# Patient Record
Sex: Male | Born: 1975 | Race: Black or African American | Hispanic: No | Marital: Married | Smoking: Current every day smoker
Health system: Southern US, Community
[De-identification: ages and names within clinical notes are randomized; demographics above are authoritative.]

## PROBLEM LIST (undated history)

## (undated) DIAGNOSIS — E119 Type 2 diabetes mellitus without complications: Secondary | ICD-10-CM

## (undated) DIAGNOSIS — K219 Gastro-esophageal reflux disease without esophagitis: Secondary | ICD-10-CM

## (undated) HISTORY — PX: WRIST SURGERY: SHX841

---

## 2007-01-23 ENCOUNTER — Emergency Department: Payer: Self-pay | Admitting: Unknown Physician Specialty

## 2007-06-20 ENCOUNTER — Emergency Department: Payer: Self-pay | Admitting: Emergency Medicine

## 2008-05-17 ENCOUNTER — Emergency Department: Payer: Self-pay | Admitting: Emergency Medicine

## 2008-07-07 ENCOUNTER — Emergency Department: Payer: Self-pay | Admitting: Unknown Physician Specialty

## 2009-07-06 ENCOUNTER — Emergency Department: Payer: Self-pay | Admitting: Emergency Medicine

## 2010-06-14 ENCOUNTER — Ambulatory Visit: Payer: Self-pay | Admitting: Family Medicine

## 2011-07-07 ENCOUNTER — Emergency Department: Payer: Self-pay | Admitting: *Deleted

## 2011-07-07 LAB — CBC
HCT: 43.2 % (ref 40.0–52.0)
MCHC: 33.6 g/dL (ref 32.0–36.0)
MCV: 85 fL (ref 80–100)
Platelet: 354 10*3/uL (ref 150–440)
RBC: 5.1 10*6/uL (ref 4.40–5.90)

## 2011-07-07 LAB — COMPREHENSIVE METABOLIC PANEL
Alkaline Phosphatase: 50 U/L (ref 50–136)
Anion Gap: 7 (ref 7–16)
Bilirubin,Total: 0.4 mg/dL (ref 0.2–1.0)
Co2: 26 mmol/L (ref 21–32)
Creatinine: 1.03 mg/dL (ref 0.60–1.30)
Glucose: 108 mg/dL — ABNORMAL HIGH (ref 65–99)
Potassium: 4 mmol/L (ref 3.5–5.1)

## 2013-07-15 ENCOUNTER — Emergency Department: Payer: Self-pay | Admitting: Emergency Medicine

## 2015-06-26 ENCOUNTER — Ambulatory Visit
Admission: EM | Admit: 2015-06-26 | Discharge: 2015-06-26 | Disposition: A | Discharge status: Home / Self Care | Payer: BLUE CROSS/BLUE SHIELD | Attending: Family Medicine | Admitting: Family Medicine

## 2015-06-26 ENCOUNTER — Encounter: Payer: Self-pay | Admitting: *Deleted

## 2015-06-26 DIAGNOSIS — R300 Dysuria: Secondary | ICD-10-CM

## 2015-06-26 DIAGNOSIS — Z202 Contact with and (suspected) exposure to infections with a predominantly sexual mode of transmission: Secondary | ICD-10-CM

## 2015-06-26 HISTORY — DX: Gastro-esophageal reflux disease without esophagitis: K21.9

## 2015-06-26 LAB — URINALYSIS COMPLETE WITH MICROSCOPIC (ARMC ONLY)
BILIRUBIN URINE: NEGATIVE
Bacteria, UA: NONE SEEN
Glucose, UA: NEGATIVE mg/dL
Leukocytes, UA: NEGATIVE
Nitrite: NEGATIVE
Protein, ur: NEGATIVE mg/dL
Squamous Epithelial / LPF: NONE SEEN
pH: 6 (ref 5.0–8.0)

## 2015-06-26 LAB — CHLAMYDIA/NGC RT PCR (ARMC ONLY)
CHLAMYDIA TR: NOT DETECTED
N gonorrhoeae: NOT DETECTED

## 2015-06-26 MED ORDER — AZITHROMYCIN 500 MG PO TABS
1000.0000 mg | ORAL_TABLET | Freq: Once | ORAL | Status: AC
  Administered 2015-06-26: 1000 mg via ORAL

## 2015-06-26 MED ORDER — CEFTRIAXONE SODIUM 250 MG IJ SOLR
250.0000 mg | Freq: Once | INTRAMUSCULAR | Status: AC
  Administered 2015-06-26: 250 mg via INTRAMUSCULAR

## 2015-06-26 NOTE — ED Notes (Signed)
Pt denies itching or rash from medication/injection.

## 2015-06-26 NOTE — ED Notes (Signed)
Patient started having symptoms burning on urination and a constant tingling feeling on the genitales yesterday morning after having intercourse on 2 days ago.

## 2015-06-26 NOTE — ED Provider Notes (Signed)
CSN: [id number]     Arrival date & time 06/26/15  1711 History   First MD Initiated Contact with Patient 06/26/15 1754     Chief Complaint  Patient presents with  . Exposure to STD   (Consider location/radiation/quality/duration/timing/severity/associated sxs/prior Treatment) HPI Comments: 40 yo male with a c/o burning with urination and "tingling" of the penis for 2 days. States had unprotected intercourse earlier in the week and might have been exposed to STD. Also states has had GC and chlamydia in the past and symptoms now are similar. Denies any penile discharge, fevers or chills.   Patient is a 40 y.o. male presenting with STD exposure. The history is provided by the patient.  Exposure to STD    Past Medical History  Diagnosis Date  . GERD (gastroesophageal reflux disease)    Past Surgical History  Procedure Laterality Date  . Wrist surgery Right     severe laceration   History reviewed. No pertinent family history. Social History  Substance Use Topics  . Smoking status: Current Every Day Smoker  . Smokeless tobacco: Never Used  . Alcohol Use: Yes    Review of Systems  Allergies  Review of patient's allergies indicates no known allergies.  Home Medications   Prior to Admission medications   Medication Sig Start Date End Date Taking? Authorizing Provider  omeprazole (PRILOSEC OTC) 20 MG tablet Take 20 mg by mouth daily.   Yes Historical Provider, MD   Meds Ordered and Administered this Visit   Medications  cefTRIAXone (ROCEPHIN) injection 250 mg (250 mg Intramuscular Given 06/26/15 1836)  azithromycin (ZITHROMAX) tablet 1,000 mg (1,000 mg Oral Given 06/26/15 1837)    BP 135/86 mmHg  Pulse 104  Temp(Src) 98.6 F (37 C) (Oral)  Resp 18  Ht 5\' 9"  (1.753 m)  Wt 219 lb (99.338 kg)  BMI 32.33 kg/m2  SpO2 98% No data found.   Physical Exam  Constitutional: He appears well-developed and well-nourished. No distress.  Genitourinary: Testes normal and penis  normal. Cremasteric reflex is present. Right testis shows no tenderness. Left testis shows no tenderness. No penile erythema or penile tenderness. No discharge found.  Lymphadenopathy:       Right: No inguinal adenopathy present.       Left: No inguinal adenopathy present.  Skin: No rash noted. He is not diaphoretic.  Nursing note and vitals reviewed.   ED Course  Procedures (including critical care time)  Labs Review Labs Reviewed  URINALYSIS COMPLETEWITH MICROSCOPIC ([facility] ONLY) - Abnormal; Notable for the following:    Ketones, ur TRACE (*)    Specific Gravity, Urine >1.030 (*)    Hgb urine dipstick TRACE (*)    All other components within normal limits  URINE CULTURE  CHLAMYDIA/NGC RT PCR ([facility] ONLY)  RPR  HIV ANTIBODY (ROUTINE TESTING)    Imaging Review No results found.   Visual Acuity Review  Right Eye Distance:   Left Eye Distance:   Bilateral Distance:    Right Eye Near:   Left Eye Near:    Bilateral Near:         MDM   1. Dysuria   2. STD exposure     1. Lab results and diagnosis reviewed with patient 2. Patient given empiric treatment with rocephin 250mg  IM x 1 and zithromax 1gm po x 1 3. Send urine for GC/Chlamydia test; blood test for RPR and HIV 4. Follow-up prn if symptoms worsen or don't improve  Payton Mccallumrlando Avalie Oconnor, MD 06/26/15 2031

## 2015-06-28 LAB — URINE CULTURE: Culture: NO GROWTH

## 2015-06-28 LAB — RPR: RPR Ser Ql: NONREACTIVE

## 2015-06-28 LAB — HIV ANTIBODY (ROUTINE TESTING W REFLEX): HIV SCREEN 4TH GENERATION: NONREACTIVE

## 2015-07-02 ENCOUNTER — Telehealth: Payer: Self-pay | Admitting: *Deleted

## 2015-07-02 NOTE — ED Notes (Signed)
Called patient and informed him the all of his labs RPR, HIV, Chlamydia, gonorrhea, and UTI came back negative. Patient reported that his sore was improving.

## 2015-09-19 ENCOUNTER — Ambulatory Visit
Admission: EM | Admit: 2015-09-19 | Discharge: 2015-09-19 | Disposition: A | Discharge status: Home / Self Care | Payer: BLUE CROSS/BLUE SHIELD | Attending: Family Medicine | Admitting: Family Medicine

## 2015-09-19 DIAGNOSIS — K0510 Chronic gingivitis, plaque induced: Secondary | ICD-10-CM | POA: Diagnosis not present

## 2015-09-19 DIAGNOSIS — K029 Dental caries, unspecified: Secondary | ICD-10-CM | POA: Diagnosis not present

## 2015-09-19 DIAGNOSIS — R590 Localized enlarged lymph nodes: Secondary | ICD-10-CM

## 2015-09-19 DIAGNOSIS — R599 Enlarged lymph nodes, unspecified: Secondary | ICD-10-CM

## 2015-09-19 MED ORDER — PENICILLIN V POTASSIUM 500 MG PO TABS: 500.0000 mg | ORAL_TABLET | Freq: Three times a day (TID) | ORAL | Status: DC

## 2015-09-19 NOTE — ED Provider Notes (Signed)
CSN: [id number]     Arrival date & time 09/19/15  1530 History   None    Chief Complaint  Patient presents with  . Lymphadenopathy   (Consider location/radiation/quality/duration/timing/severity/associated sxs/prior Treatment) HPI Comments: 40 yo male with a 2 days h/o tender left swollen neck gland. States has dental cavities and a left upper molar broke one week ago. Denies any difficulty breathing or swallowing, fevers ,chills, chest pain, shortness of breath.   The history is provided by the patient.    Past Medical History  Diagnosis Date  . GERD (gastroesophageal reflux disease)    Past Surgical History  Procedure Laterality Date  . Wrist surgery Right     severe laceration   No family history on file. Social History  Substance Use Topics  . Smoking status: Current Every Day Smoker  . Smokeless tobacco: Never Used  . Alcohol Use: Yes    Review of Systems  Allergies  Review of patient's allergies indicates no known allergies.  Home Medications   Prior to Admission medications   Medication Sig Start Date End Date Taking? Authorizing Provider  omeprazole (PRILOSEC OTC) 20 MG tablet Take 20 mg by mouth daily.   Yes Historical Provider, MD  penicillin v potassium (VEETID) 500 MG tablet Take 1 tablet (500 mg total) by mouth 3 (three) times daily. 09/19/15   Payton Mccallum, MD   Meds Ordered and Administered this Visit  Medications - No data to display  BP 136/88 mmHg  Pulse 93  Temp(Src) 98.4 F (36.9 C) (Oral)  Resp 16  Ht 5\' 10"  (1.778 m)  Wt 216 lb (97.977 kg)  BMI 30.99 kg/m2  SpO2 99% No data found.   Physical Exam  Constitutional: He appears well-developed and well-nourished. No distress.  HENT:  Mouth/Throat: Oropharynx is clear and moist. Abnormal dentition (broken tooth left upper molar). Dental caries present.  Gums swollen erythematous  Neck: Normal range of motion. Neck supple.  Lymphadenopathy:    He has cervical adenopathy (left anterior  cervical, tender, mobile).  Skin: He is not diaphoretic.  Nursing note and vitals reviewed.   ED Course  Procedures (including critical care time)  Labs Review Labs Reviewed - No data to display  Imaging Review No results found.   Visual Acuity Review  Right Eye Distance:   Left Eye Distance:   Bilateral Distance:    Right Eye Near:   Left Eye Near:    Bilateral Near:         MDM   1. Gingivitis   2. Dental caries   3. Lymphadenopathy of left cervical region    Discharge Medication List as of 09/19/2015  4:31 PM    START taking these medications   Details  penicillin v potassium (VEETID) 500 MG tablet Take 1 tablet (500 mg total) by mouth 3 (three) times daily., Starting 09/19/2015, Until Discontinued, Normal       1.  diagnosis reviewed with patient 2. rx as per orders above; reviewed possible side effects, interactions, risks and benefits  3. Follow-up with dentist or prn if symptoms worsen or don't improve    Payton Mccallum, MD 09/19/15 [phone]

## 2015-09-19 NOTE — ED Notes (Signed)
As per pt onset yesterday noticed swollen gland on Left side sore to touch and also pain gets worst when pt moves his neck.

## 2015-09-19 NOTE — Discharge Instructions (Signed)

## 2015-11-19 ENCOUNTER — Ambulatory Visit (INDEPENDENT_AMBULATORY_CARE_PROVIDER_SITE_OTHER): Payer: BLUE CROSS/BLUE SHIELD

## 2015-11-19 ENCOUNTER — Ambulatory Visit
Admission: EM | Admit: 2015-11-19 | Discharge: 2015-11-19 | Disposition: A | Discharge status: Home / Self Care | Payer: BLUE CROSS/BLUE SHIELD | Attending: Emergency Medicine | Admitting: Emergency Medicine

## 2015-11-19 DIAGNOSIS — IMO0001 Reserved for inherently not codable concepts without codable children: Secondary | ICD-10-CM

## 2015-11-19 DIAGNOSIS — S8992XA Unspecified injury of left lower leg, initial encounter: Secondary | ICD-10-CM

## 2015-11-19 DIAGNOSIS — M7021 Olecranon bursitis, right elbow: Secondary | ICD-10-CM | POA: Diagnosis not present

## 2015-11-19 MED ORDER — NAPROXEN 500 MG PO TABS: 500.0000 mg | ORAL_TABLET | Freq: Two times a day (BID) | ORAL | 0 refills | Status: DC

## 2015-11-19 NOTE — ED Provider Notes (Signed)
CSN: [id number]     Arrival date & time 11/19/15  1602 History   First MD Initiated Contact with Patient 11/19/15 1628     Chief Complaint  Patient presents with  . Joint Swelling  . Leg Swelling   (Consider location/radiation/quality/duration/timing/severity/associated sxs/prior Treatment) HPI  This a 40 year old male who presents with 2 separate injuries. Both injury started about 2 weeks ago and separate accidents. The first is a calf injury of his left calf that happened at work but the patient does not wish to file [employer]. He states that he was coming down the lead or when he felt a pop in his calf noticed a swelling later on and has remained swollen. He states that he will seem to be improving if the initial injury but reinjured it again and has noticed additional swelling and pain. He has no numbness or tingling into his foot.  Second injury was when he hit his elbow against a Machine at work and noticed pain over the olecranon with swelling that has not decreased. He states today that this is the reason that caused him to come in today more so than the calf. He states that he does not take use his elbow to rest on and does not continues to put pressure on his elbow. 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       Past Medical History:  Diagnosis Date  . GERD (gastroesophageal reflux disease)    Past Surgical History:  Procedure Laterality Date  . WRIST SURGERY Right    severe laceration   History reviewed. No pertinent family history. Social History  Substance Use Topics  . Smoking status: Current Every Day Smoker    Packs/day: 1.00    Years: 24.00  . Smokeless tobacco: Never Used  . Alcohol use Yes    Review of Systems  Constitutional: Negative for activity change, chills, fatigue and fever.  Musculoskeletal: Positive for arthralgias and myalgias.  All other systems reviewed and are negative.   Allergies  Review of patient's allergies  indicates no known allergies.  Home Medications   Prior to Admission medications   Medication Sig Start Date End Date Taking? Authorizing Provider  naproxen (NAPROSYN) 500 MG tablet Take 1 tablet (500 mg total) by mouth 2 (two) times daily with a meal. 11/19/15   Lutricia FeilWilliam P Gavyn Zoss, PA-C  omeprazole (PRILOSEC OTC) 20 MG tablet Take 20 mg by mouth daily.    Historical Provider, MD   Meds Ordered and Administered this Visit  Medications - No data to display  BP (!) 144/87 (BP Location: Left Arm)   Pulse (!) 107   Temp 98.3 F (36.8 C) (Oral)   Resp 16   Ht 5\' 9"  (1.753 m)   Wt 215 lb (97.5 kg)   SpO2 99%   BMI 31.75 kg/m  No data found.   Physical Exam  Constitutional: He is oriented to person, place, and time. He appears well-developed and well-nourished. No distress.  HENT:  Head: Normocephalic and atraumatic.  Eyes: EOM are normal. Pupils are equal, round, and reactive to light. Right eye exhibits no discharge. Left eye exhibits no discharge.  Neck: Normal range of motion. Neck supple.  Musculoskeletal: Normal range of motion. He exhibits edema and tenderness.  Examination of the right dominant elbow shows swelling over the olecranon. It is tender to pressure. There is no warmth or erythema present. Motion of his elbow is full and comfortable.  Examination of the left calf  shows some swelling and is present. Measurement of the left calf is 42 cm . Right calf measurement is 41 cm. Neurovascular function is intact distally.  Neurological: He is alert and oriented to person, place, and time.  Skin: Skin is warm and dry. He is not diaphoretic.  Psychiatric: He has a normal mood and affect. His behavior is normal. Judgment and thought content normal.  Nursing note and vitals reviewed.   Urgent Care Course   Clinical Course    Procedures (including critical care time)  Labs Review Labs Reviewed - No data to display  Imaging Review Dg Elbow Complete Right  Result Date:  11/19/2015 CLINICAL DATA:  Right elbow swelling starting 2 weeks ago after trauma. EXAM: RIGHT ELBOW - COMPLETE 3+ VIEW COMPARISON:  None. FINDINGS: Abnormal soft tissue swelling overlying the olecranon. Olecranon spur noted. Slight irregularity of the coronoid process along the sublime tubercle is probably from spurring, less likely to be from an avulsion. No well-defined fracture identified. IMPRESSION: 1. Abnormal soft tissue swelling dorsal to the olecranon. Olecranon bursitis not excluded. No elbow joint effusion. 2. Suspected spurring along the coronoid process. Electronically Signed   By: Gaylyn Rong M.D.   On: 11/19/2015 17:03     Visual Acuity Review  Right Eye Distance:   Left Eye Distance:   Bilateral Distance:    Right Eye Near:   Left Eye Near:    Bilateral Near:         MDM   1. Olecranon bursitis, right elbow   2. Injury of plantaris muscle or tendon, left, initial encounter    Discharge Medication List as of 11/19/2015  5:23 PM    START taking these medications   Details  naproxen (NAPROSYN) 500 MG tablet Take 1 tablet (500 mg total) by mouth 2 (two) times daily with a meal., Starting Thu 11/19/2015, Normal      Plan: 1. Test/x-ray results and diagnosis reviewed with patient 2. rx as per orders; risks, benefits, potential side effects reviewed with patient 3. Recommend supportive treatment with Heat to the calf. He must that not place any pressure on the elbow. His calf but becomes more swollen or more painful if he develops any numbness or tingling into his foot pain is worse he should go the emergency department. 4. F/u prn if symptoms worsen or don't improve     Lutricia Feil, PA-C 11/19/15 1729

## 2015-11-19 NOTE — ED Triage Notes (Signed)
Patient reports that right elbow and left calf are swollen. Patient states that this started 2 weeks ago. Patient states that he felt a pulling in his left calf when he was trying to reach for something. Patient states that elbow hit something around 2 weeks ago and has remained swollen. Patient denies any pain at either location.

## 2016-01-11 ENCOUNTER — Encounter: Payer: Self-pay | Admitting: *Deleted

## 2016-01-11 ENCOUNTER — Ambulatory Visit
Admission: EM | Admit: 2016-01-11 | Discharge: 2016-01-11 | Disposition: A | Discharge status: Home / Self Care | Payer: BLUE CROSS/BLUE SHIELD | Attending: Family Medicine | Admitting: Family Medicine

## 2016-01-11 ENCOUNTER — Ambulatory Visit (INDEPENDENT_AMBULATORY_CARE_PROVIDER_SITE_OTHER): Payer: BLUE CROSS/BLUE SHIELD

## 2016-01-11 DIAGNOSIS — L03116 Cellulitis of left lower limb: Secondary | ICD-10-CM

## 2016-01-11 DIAGNOSIS — S91332A Puncture wound without foreign body, left foot, initial encounter: Secondary | ICD-10-CM | POA: Diagnosis not present

## 2016-01-11 MED ORDER — TETANUS-DIPHTH-ACELL PERTUSSIS 5-2.5-18.5 LF-MCG/0.5 IM SUSP
0.5000 mL | Freq: Once | INTRAMUSCULAR | Status: AC
  Administered 2016-01-11: 0.5 mL via INTRAMUSCULAR

## 2016-01-11 MED ORDER — CEFTRIAXONE SODIUM 1 G IJ SOLR
1.0000 g | Freq: Once | INTRAMUSCULAR | Status: AC
  Administered 2016-01-11: 1 g via INTRAMUSCULAR

## 2016-01-11 MED ORDER — CIPROFLOXACIN HCL 750 MG PO TABS: 750.0000 mg | ORAL_TABLET | Freq: Two times a day (BID) | ORAL | 0 refills | Status: DC

## 2016-01-11 NOTE — Discharge Instructions (Signed)
Recommend follow up with PCP in 2-3 days for recheck infection or sooner if worsening

## 2016-01-11 NOTE — ED Triage Notes (Signed)
Patient stepped on a nail with his left foot yesterday.

## 2016-01-11 NOTE — ED Provider Notes (Addendum)
[facility]    CSN: [id number] Arrival date & time: 01/11/16  1643     History   Chief Complaint Chief Complaint  Patient presents with  . Foot Pain    HPI Danne Harboreter A Pribyl is a 40 y.o. male.   40 yo male presents with a c/o puncture wound to bottom of left foot after stepping on a nail yesterday. States nail went through his flip-flop and punctured his foot. Today noticed red and swollen. Denies any fevers or chills.    The history is provided by the patient.  Foot Pain     Past Medical History:  Diagnosis Date  . GERD (gastroesophageal reflux disease)     There are no active problems to display for this patient.   Past Surgical History:  Procedure Laterality Date  . WRIST SURGERY Right    severe laceration       Home Medications    Prior to Admission medications   Medication Sig Start Date End Date Taking? Authorizing Provider  ciprofloxacin (CIPRO) 750 MG tablet Take 1 tablet (750 mg total) by mouth 2 (two) times daily. 01/11/16   Payton Mccallumrlando Skyelynn Rambeau, MD  naproxen (NAPROSYN) 500 MG tablet Take 1 tablet (500 mg total) by mouth 2 (two) times daily with a meal. 11/19/15   Lutricia FeilWilliam P Roemer, PA-C  omeprazole (PRILOSEC OTC) 20 MG tablet Take 20 mg by mouth daily.    Historical Provider, MD    Family History History reviewed. No pertinent family history.  Social History Social History  Substance Use Topics  . Smoking status: Current Every Day Smoker    Packs/day: 1.00    Years: 24.00  . Smokeless tobacco: Never Used  . Alcohol use Yes     Allergies   Patient has no known allergies.   Review of Systems Review of Systems   Physical Exam Triage Vital Signs ED Triage Vitals  Enc Vitals Group     BP 01/11/16 1652 (!) 148/81     Pulse Rate 01/11/16 1652 78     Resp 01/11/16 1652 18     Temp 01/11/16 1652 97.2 F (36.2 C)     Temp src --      SpO2 01/11/16 1652 100 %     Weight 01/11/16 1655 215 lb (97.5 kg)     Height 01/11/16 1655 5'  9" (1.753 m)     Head Circumference --      Peak Flow --      Pain Score 01/11/16 1657 10     Pain Loc --      Pain Edu? --      Excl. in GC? --    No data found.   Updated Vital Signs BP (!) 148/81 (BP Location: Left Arm)   Pulse 78   Temp 97.2 F (36.2 C)   Resp 18   Ht 5\' 9"  (1.753 m)   Wt 215 lb (97.5 kg)   SpO2 100%   BMI 31.75 kg/m   Visual Acuity Right Eye Distance:   Left Eye Distance:   Bilateral Distance:    Right Eye Near:   Left Eye Near:    Bilateral Near:     Physical Exam  Constitutional: He appears well-developed and well-nourished. No distress.  Musculoskeletal:       Left foot: There is tenderness and swelling. There is normal range of motion, no bony tenderness, normal capillary refill, no crepitus, no deformity and no laceration.  Left foot neurovascularly intact; pinpoint puncture  wound noted on plantar aspect of left foot; diffuse blanchable erythema, warmth and tenderness to palpation over the dorsum of the foot  Skin: He is not diaphoretic.  Nursing note and vitals reviewed.    UC Treatments / Results  Labs (all labs ordered are listed, but only abnormal results are displayed) Labs Reviewed - No data to display  EKG  EKG Interpretation None       Radiology Dg Foot Complete Left  Result Date: 01/11/2016 CLINICAL DATA:  Stepped on nail EXAM: LEFT FOOT - COMPLETE 3+ VIEW COMPARISON:  None. FINDINGS: No fracture or dislocation is seen. The joint spaces are preserved. Mild soft tissue swelling along the plantar aspect of the forefoot. Radiopaque foreign body is seen. IMPRESSION: Mild soft tissue swelling along the plantar aspect of the forefoot. No fracture, dislocation, or radiopaque foreign body is seen. Electronically Signed   By: Charline BillsSriyesh  Krishnan M.D.   On: 01/11/2016 17:18    Procedures Procedures (including critical care time)  Medications Ordered in UC Medications  Tdap (BOOSTRIX) injection 0.5 mL (0.5 mLs Intramuscular  Given 01/11/16 1704)  cefTRIAXone (ROCEPHIN) injection 1 g (1 g Intramuscular Given 01/11/16 1756)     Initial Impression / Assessment and Plan / UC Course  I have reviewed the triage vital signs and the nursing notes.  Pertinent labs & imaging results that were available during my care of the patient were reviewed by me and considered in my medical decision making (see chart for details).  Clinical Course       Final Clinical Impressions(s) / UC Diagnoses   Final diagnoses:  Puncture wound of left foot, initial encounter  Cellulitis of foot, left    New Prescriptions Discharge Medication List as of 01/11/2016  5:56 PM    START taking these medications   Details  ciprofloxacin (CIPRO) 750 MG tablet Take 1 tablet (750 mg total) by mouth 2 (two) times daily., Starting Mon 01/11/2016, Normal       1. x-ray results (negative for fracture or foreign body) and diagnosis reviewed with patient 2. rx as per orders above; reviewed possible side effects, interactions, risks and benefits  3. Patient given Rocephin 1gm IM x1; also given Tdap vaccine 4. Recommend supportive treatment with warm compresses, elevation, wound care 5. Close follow up recommended and discussed with patient due to potential risks; recommend follow up in 2-3 days (with PCP or here) for recheck or sooner prn if symptoms worsen   Payton Mccallumrlando Lawanna Cecere, MD 01/11/16 1844    Payton Mccallumrlando Shaterrica Territo, MD 01/11/16 1845

## 2016-01-14 ENCOUNTER — Telehealth: Payer: Self-pay | Admitting: *Deleted

## 2016-01-14 NOTE — Telephone Encounter (Signed)
Courtesy call back, verified DOB, patient reported feeling much better and was back to work. Advised patient to follow up with PCP or [facility] if symptoms of infection return.

## 2016-02-06 ENCOUNTER — Emergency Department
Admission: EM | Admit: 2016-02-06 | Discharge: 2016-02-06 | Disposition: A | Discharge status: Home / Self Care | Payer: BLUE CROSS/BLUE SHIELD | Attending: Emergency Medicine | Admitting: Emergency Medicine

## 2016-02-06 ENCOUNTER — Emergency Department: Payer: BLUE CROSS/BLUE SHIELD

## 2016-02-06 ENCOUNTER — Encounter: Payer: Self-pay | Admitting: Emergency Medicine

## 2016-02-06 DIAGNOSIS — F17200 Nicotine dependence, unspecified, uncomplicated: Secondary | ICD-10-CM | POA: Diagnosis not present

## 2016-02-06 DIAGNOSIS — R109 Unspecified abdominal pain: Secondary | ICD-10-CM

## 2016-02-06 DIAGNOSIS — Z79899 Other long term (current) drug therapy: Secondary | ICD-10-CM | POA: Diagnosis not present

## 2016-02-06 DIAGNOSIS — K529 Noninfective gastroenteritis and colitis, unspecified: Secondary | ICD-10-CM | POA: Diagnosis not present

## 2016-02-06 LAB — COMPREHENSIVE METABOLIC PANEL
ALBUMIN: 4.4 g/dL (ref 3.5–5.0)
ALK PHOS: 48 U/L (ref 38–126)
ALT: 41 U/L (ref 17–63)
AST: 39 U/L (ref 15–41)
Anion gap: 6 (ref 5–15)
BILIRUBIN TOTAL: 0.6 mg/dL (ref 0.3–1.2)
BUN: 14 mg/dL (ref 6–20)
CALCIUM: 9.9 mg/dL (ref 8.9–10.3)
CO2: 25 mmol/L (ref 22–32)
Chloride: 106 mmol/L (ref 101–111)
Creatinine, Ser: 0.77 mg/dL (ref 0.61–1.24)
GFR calc Af Amer: >60 mL/min (ref >60)
GFR calc non Af Amer: >60 mL/min (ref >60)
GLUCOSE: 143 mg/dL — AB (ref 65–99)
Potassium: 4.3 mmol/L (ref 3.5–5.1)
SODIUM: 137 mmol/L (ref 135–145)
TOTAL PROTEIN: 7.6 g/dL (ref 6.5–8.1)

## 2016-02-06 LAB — URINALYSIS, COMPLETE (UACMP) WITH MICROSCOPIC
BACTERIA UA: NONE SEEN
Bilirubin Urine: NEGATIVE
GLUCOSE, UA: NEGATIVE mg/dL
KETONES UR: NEGATIVE mg/dL
Leukocytes, UA: NEGATIVE
NITRITE: NEGATIVE
PH: 6 (ref 5.0–8.0)
PROTEIN: NEGATIVE mg/dL
Specific Gravity, Urine: 1.01 (ref 1.005–1.030)
Squamous Epithelial / LPF: NONE SEEN

## 2016-02-06 LAB — CBC
HCT: 42.7 % (ref 40.0–52.0)
HEMOGLOBIN: 14.7 g/dL (ref 13.0–18.0)
MCH: 27.4 pg (ref 26.0–34.0)
MCHC: 34.4 g/dL (ref 32.0–36.0)
MCV: 79.8 fL — ABNORMAL LOW (ref 80.0–100.0)
Platelets: 351 10*3/uL (ref 150–440)
RBC: 5.35 MIL/uL (ref 4.40–5.90)
RDW: 14.5 % (ref 11.5–14.5)
WBC: 6.5 10*3/uL (ref 3.8–10.6)

## 2016-02-06 LAB — LIPASE, BLOOD: Lipase: 31 U/L (ref 11–51)

## 2016-02-06 MED ORDER — METRONIDAZOLE 500 MG PO TABS: 500.0000 mg | ORAL_TABLET | Freq: Three times a day (TID) | ORAL | 0 refills | Status: DC

## 2016-02-06 MED ORDER — CIPROFLOXACIN HCL 500 MG PO TABS: 500.0000 mg | ORAL_TABLET | Freq: Two times a day (BID) | ORAL | 0 refills | Status: DC

## 2016-02-06 MED ORDER — SODIUM CHLORIDE 0.9 % IV BOLUS (SEPSIS)
1000.0000 mL | Freq: Once | INTRAVENOUS | Status: AC
  Administered 2016-02-06: 1000 mL via INTRAVENOUS

## 2016-02-06 MED ORDER — IBUPROFEN 600 MG PO TABS: 600.0000 mg | ORAL_TABLET | Freq: Four times a day (QID) | ORAL | 0 refills | Status: DC | PRN

## 2016-02-06 MED ORDER — ONDANSETRON HCL 4 MG/2ML IJ SOLN
4.0000 mg | Freq: Once | INTRAMUSCULAR | Status: AC
  Administered 2016-02-06: 4 mg via INTRAVENOUS
  Filled 2016-02-06: qty 2

## 2016-02-06 MED ORDER — CIPROFLOXACIN HCL 500 MG PO TABS
500.0000 mg | ORAL_TABLET | Freq: Two times a day (BID) | ORAL | 0 refills | Status: DC
Start: 2016-02-06 — End: 2016-02-06

## 2016-02-06 MED ORDER — HYDROCODONE-ACETAMINOPHEN 5-325 MG PO TABS: 1.0000 | ORAL_TABLET | Freq: Four times a day (QID) | ORAL | 0 refills | Status: DC | PRN

## 2016-02-06 MED ORDER — IOPAMIDOL (ISOVUE-300) INJECTION 61%
100.0000 mL | Freq: Once | INTRAVENOUS | Status: AC | PRN
  Administered 2016-02-06: 100 mL via INTRAVENOUS

## 2016-02-06 MED ORDER — ONDANSETRON 4 MG PO TBDP: 4.0000 mg | ORAL_TABLET | Freq: Four times a day (QID) | ORAL | 0 refills | Status: DC | PRN

## 2016-02-06 MED ORDER — CIPROFLOXACIN HCL 500 MG PO TABS
500.0000 mg | ORAL_TABLET | Freq: Two times a day (BID) | ORAL | 0 refills | Status: DC
Start: 2016-02-06 — End: 2016-08-20

## 2016-02-06 MED ORDER — HYDROCODONE-ACETAMINOPHEN 5-325 MG PO TABS
1.0000 | ORAL_TABLET | Freq: Four times a day (QID) | ORAL | 0 refills | Status: DC | PRN
Start: 2016-02-06 — End: 2016-02-06

## 2016-02-06 MED ORDER — MORPHINE SULFATE (PF) 4 MG/ML IV SOLN
6.0000 mg | Freq: Once | INTRAVENOUS | Status: AC
  Administered 2016-02-06: 6 mg via INTRAVENOUS
  Filled 2016-02-06: qty 2

## 2016-02-06 MED ORDER — IOPAMIDOL (ISOVUE-300) INJECTION 61%: 30.0000 mL | Freq: Once | INTRAVENOUS | Status: DC | PRN

## 2016-02-06 NOTE — Discharge Instructions (Signed)
We believe your symptoms are caused by inflammation or possibly infection in the colon (though we can not be certain).  Most of the time this condition (please read through the included information) can be cured with outpatient antibiotics.  Please take the full course of prescribed medication(s) and follow up with the doctors recommended above.  Return to the ED if your abdominal pain worsens or fails to improve, you develop bloody vomiting, bloody diarrhea, you are unable to tolerate fluids due to vomiting, fever greater than 101, or other symptoms that concern you.

## 2016-02-06 NOTE — ED Provider Notes (Signed)
[facility] Emergency Department Provider Note   ____________________________________________   First MD Initiated Contact with Patient 02/06/16 0901     (approximate)  I have reviewed the triage vital signs and the nursing notes.   HISTORY  Chief Complaint Abdominal Pain    HPI Kevin Harris is a 40 y.o. male reports he's been having pain in his right mid abdomen that radiates around towards his right mid to lower back for about 5 days to week. The pain is somewhat intermittent in nature, with some severe worsening sharp pain at times however has had a fairly consistent mild discomfort with episodes of severe worsening between for about 5 days.  Size doctor was scheduled for a CAT scan today to evaluate for a "kidney stone", reports the pain became increased and also if he got a CAT scan today he went to get the report on Monday.  He is told by his doctor this cholesterol is high, and will need treatment for that.  No fevers or chills. Mild nausea but no vomiting. No diarrhea or constipation. Has continued to eat well. Reports presently a moderate to severe pain in the right mid flank. Nothing seems to make it much better and nothing seems to worsen it, though will slowly come off on its own at times  Does use alcohol about 1-2 days a week, sometimes up to a fifth of vodka however. This pain did start a couple days after going out "drinking" with his friends, was not sure that it's related   Past Medical History:  Diagnosis Date  . GERD (gastroesophageal reflux disease)     There are no active problems to display for this patient.   Past Surgical History:  Procedure Laterality Date  . WRIST SURGERY Right    severe laceration    Prior to Admission medications   Medication Sig Start Date End Date Taking? Authorizing Provider  ciprofloxacin (CIPRO) 500 MG tablet Take 1 tablet (500 mg total) by mouth 2 (two) times daily. 02/06/16   Sharyn CreamerMark Quale,  MD  HYDROcodone-acetaminophen (NORCO/VICODIN) 5-325 MG tablet Take 1 tablet by mouth every 6 (six) hours as needed for moderate pain. 02/06/16   Sharyn CreamerMark Quale, MD  metroNIDAZOLE (FLAGYL) 500 MG tablet Take 1 tablet (500 mg total) by mouth 3 (three) times daily. 02/06/16   Sharyn CreamerMark Quale, MD  ondansetron (ZOFRAN ODT) 4 MG disintegrating tablet Take 1 tablet (4 mg total) by mouth every 6 (six) hours as needed for nausea or vomiting. 02/06/16   Sharyn CreamerMark Quale, MD    Allergies Patient has no known allergies.  No family history on file.  Social History Social History  Substance Use Topics  . Smoking status: Current Every Day Smoker    Packs/day: 1.00    Years: 24.00  . Smokeless tobacco: Never Used  . Alcohol use Yes    Review of Systems Constitutional: No fever/chills Eyes: No visual changes. ENT: No sore throat. Cardiovascular: Denies chest pain. Respiratory: Denies shortness of breath. Gastrointestinal: No diarrhea.  No constipation. Genitourinary: Negative for dysuria.Was told there might be some blood in his urine. Musculoskeletal: Negative for back pain. Skin: Negative for rash. Neurological: Negative for headaches, focal weakness or numbness.  10-point ROS otherwise negative.  ____________________________________________   PHYSICAL EXAM:  VITAL SIGNS: ED Triage Vitals  Enc Vitals Group     BP 02/06/16 0844 (!) 177/93     Pulse Rate 02/06/16 0844 89     Resp 02/06/16 0844 18  Temp 02/06/16 0844 97.5 F (36.4 C)     Temp Source 02/06/16 0844 Oral     SpO2 02/06/16 0844 98 %     Weight 02/06/16 0845 228 lb (103.4 kg)     Height 02/06/16 0845 5\' 10"  (1.778 m)     Head Circumference --      Peak Flow --      Pain Score 02/06/16 0845 7     Pain Loc --      Pain Edu? --      Excl. in GC? --     Constitutional: Alert and oriented. Well appearing and in no acute distress.Does appear in moderate pain, sitting up with hand over her right mid back. Eyes: Conjunctivae are  normal. PERRL. EOMI. Head: Atraumatic. Nose: No congestion/rhinnorhea. Mouth/Throat: Mucous membranes are moist.  Oropharynx non-erythematous. Neck: No stridor.   Cardiovascular: Normal rate, regular rhythm. Grossly normal heart sounds.  Good peripheral circulation. Respiratory: Normal respiratory effort.  No retractions. Lungs CTAB. Gastrointestinal: Soft and nontender to for some mild tenderness in the right mid flank. Negative Murphy. No pain in the right lower quadrant. No pain at McBurney's point. No distention. No abdominal bruits. No CVA tenderness. Musculoskeletal: No lower extremity tenderness nor edema.   Neurologic:  Normal speech and language. No gross focal neurologic deficits are appreciated. Skin:  Skin is warm, dry and intact. No rash noted. Psychiatric: Mood and affect are normal. Speech and behavior are normal.  ____________________________________________   LABS (all labs ordered are listed, but only abnormal results are displayed)  Labs Reviewed  COMPREHENSIVE METABOLIC PANEL - Abnormal; Notable for the following:       Result Value   Glucose, Bld 143 (*)    All other components within normal limits  CBC - Abnormal; Notable for the following:    MCV 79.8 (*)    All other components within normal limits  URINALYSIS, COMPLETE (UACMP) WITH MICROSCOPIC - Abnormal; Notable for the following:    Color, Urine STRAW (*)    APPearance CLEAR (*)    Hgb urine dipstick SMALL (*)    All other components within normal limits  LIPASE, BLOOD   ____________________________________________  EKG   ____________________________________________  RADIOLOGY  Ct Abdomen Pelvis W Contrast  Result Date: 02/06/2016 CLINICAL DATA:  Right abdominal and flank pain for 1 week EXAM: CT ABDOMEN AND PELVIS WITH CONTRAST TECHNIQUE: Multidetector CT imaging of the abdomen and pelvis was performed using the standard protocol following bolus administration of intravenous contrast. CONTRAST:   ISOVUE-300 IOPAMIDOL (ISOVUE-300) INJECTION 61% COMPARISON:  06/14/2010 FINDINGS: Lower chest: No acute abnormality. Hepatobiliary: No focal liver abnormality is seen. No gallstones, gallbladder wall thickening, or biliary dilatation. Pancreas: Unremarkable. No pancreatic ductal dilatation or surrounding inflammatory changes. Spleen: Normal in size without focal abnormality. Adrenals/Urinary Tract: Adrenal glands are unremarkable. Kidneys are normal, without renal calculi, focal lesion, or hydronephrosis. Bladder is unremarkable. Stomach/Bowel: Negative for bowel obstruction, significant dilatation, ileus, or free air. Normal appendix. Scattered colonic diverticulosis without acute inflammatory process. Slight wall prominence of the colon diffusely may be secondary to collapse. Difficult to exclude a mild colitis. Vascular/Lymphatic: No significant vascular findings are present. No enlarged abdominal or pelvic lymph nodes. Reproductive: Prostate is unremarkable. Other: No abdominal wall hernia or abnormality. No abdominopelvic ascites. Musculoskeletal: No acute or significant osseous findings. IMPRESSION: Diffuse wall prominence of the colon, suspect related to under distension/collapse, less likely mild diffuse colitis. Scattered colonic diverticulosis. Normal appendix No other acute intra-abdominal or pelvic  process Electronically Signed   By: Judie PetitM.  Shick M.D.   On: 02/06/2016 10:39    ____________________________________________   PROCEDURES  Procedure(s) performed: None  Procedures  Critical Care performed: No  ____________________________________________   INITIAL IMPRESSION / ASSESSMENT AND PLAN / ED COURSE  Pertinent labs & imaging results that were available during my care of the patient were reviewed by me and considered in my medical decision making (see chart for details).  Differential diagnosis includes but is not limited to, abdominal perforation, aortic dissection,  cholecystitis, appendicitis, diverticulitis, colitis, esophagitis/gastritis, kidney stone, pyelonephritis, urinary tract infection, aortic aneurysm. All are considered in decision and treatment plan. Based upon the patient's presentation and risk factors, I'm suspicious for right sided abdominal pathology primarily. He does have elevated cholesterol, almost 1000, and his primary care doctor is aware of this and so does the patient. This could be continuing to something such as pancreatitis that he has no tenderness or pain in the epigastrium or left side of the abdomen. No systemic or infectious symptoms.  With a reported blood I'm suspicious for kidney stones, but also given his exam and no previous history of stones plan to proceed with CT with contrast to evaluate also for other etiologies such as an indolent appendicitis, pyelonephritis, cholecystitis, etc.     Clinical Course as of Feb 05 1130  Sat Feb 06, 2016  1018 Patient resting comfortably. Reports pain is much better.  [MQ]    Clinical Course User Index [MQ] Sharyn CreamerMark Quale, MD   ----------------------------------------- 11:11 AM on 02/06/2016 -----------------------------------------  Patient resting comfortably. The patient reports he's been eating well, stooling normally, and not having any acute infectious symptoms. The CT however shows some mild changes that cannot easily exclude colitis. He does have diverticuli noted. Given his ongoing symptomatology, I discussed with the patient and we will trial him on antibiotics Flagyl and Cipro for a week for improvement as is possible he may have some colitis. In addition, I informed the patient and strongly recommended he follow up closely with his primary as well as a gastroenterologist for further evaluation in the next few days to week.  Patient agreeable with plan.  I will prescribe the patient a narcotic pain medicine due to their condition which I anticipate will cause at least  moderate pain short term. I discussed with the patient safe use of narcotic pain medicines, and that they are not to drive, work in dangerous areas, or ever take more than prescribed (no more than 1 pill every 6 hours). We discussed that this is the type of medication that can be  overdosed on and the risks of this type of medicine. Patient is very agreeable to only use as prescribed and to never use more than prescribed.  Prescriber database checked, patient has no controlled substance prescription last 6 months.  Return precautions and treatment recommendations and follow-up discussed with the patient who is agreeable with the plan.   ____________________________________________   FINAL CLINICAL IMPRESSION(S) / ED DIAGNOSES  Final diagnoses:  Right flank pain  Colitis, acute      NEW MEDICATIONS STARTED DURING THIS VISIT:  New Prescriptions   CIPROFLOXACIN (CIPRO) 500 MG TABLET    Take 1 tablet (500 mg total) by mouth 2 (two) times daily.   HYDROCODONE-ACETAMINOPHEN (NORCO/VICODIN) 5-325 MG TABLET    Take 1 tablet by mouth every 6 (six) hours as needed for moderate pain.   METRONIDAZOLE (FLAGYL) 500 MG TABLET    Take 1 tablet (500 mg  total) by mouth 3 (three) times daily.   ONDANSETRON (ZOFRAN ODT) 4 MG DISINTEGRATING TABLET    Take 1 tablet (4 mg total) by mouth every 6 (six) hours as needed for nausea or vomiting.     Note:  This document was prepared using Dragon voice recognition software and may include unintentional dictation errors.     Sharyn Creamer, MD 02/06/16 1131

## 2016-02-06 NOTE — ED Triage Notes (Signed)
R sided abdominal pain x 5 days. Saw MD earlier in week.

## 2016-02-06 NOTE — ED Notes (Signed)
This RN to bedside at this time. Explained to patient, whom this RN had previously met, that she would be resuming patient care. Pt states understanding. Pt family at bedside. Pt denies any needs at this time, will continue to monitor for further patient needs.

## 2016-02-06 NOTE — ED Notes (Signed)
NAD noted at time of D/C. Pt denies questions or concerns. Pt ambulatory to the lobby at this time. Pt refused wheelchair to the lobby. Pt D/C into the care of his mother.

## 2016-02-06 NOTE — ED Notes (Signed)
This RN to bedside, explained and apologized for delay to patient and family. Pt and family states understanding at this time. Will continue to monitor for further patient needs at this time.

## 2016-08-20 ENCOUNTER — Ambulatory Visit
Admission: EM | Admit: 2016-08-20 | Discharge: 2016-08-20 | Disposition: A | Discharge status: Home / Self Care | Payer: BLUE CROSS/BLUE SHIELD | Attending: Family | Admitting: Family

## 2016-08-20 DIAGNOSIS — S46812A Strain of other muscles, fascia and tendons at shoulder and upper arm level, left arm, initial encounter: Secondary | ICD-10-CM

## 2016-08-20 MED ORDER — CYCLOBENZAPRINE HCL 10 MG PO TABS: ORAL_TABLET | ORAL | 0 refills | Status: DC

## 2016-08-20 MED ORDER — DICLOFENAC SODIUM 75 MG PO TBEC: 75.0000 mg | DELAYED_RELEASE_TABLET | Freq: Two times a day (BID) | ORAL | 0 refills | Status: DC | PRN

## 2016-08-20 NOTE — Discharge Instructions (Addendum)
Recommend start Voltaren 75mg  twice a day as needed for pain. Take Flexeril 10mg - 1/2 to 1 tablet up to 3 times a day as needed for muscle spasms- may take a whole tablet at night. Apply warm moist compresses to area for comfort. Follow-up with your primary care provider in 3 to 4 days if not improving.

## 2016-08-20 NOTE — ED Triage Notes (Signed)
10 days of left shoulder pain mostly posterior. Unknown injury. Able to lift arm. Worse with some movements. Feels somewhat better when laying on it and pressure applied. Pain 8/10. Pain sometimes goes into left side of neck

## 2016-08-20 NOTE — ED Provider Notes (Signed)
CSN: [id number]     Arrival date & time 08/20/16  1126 History   First MD Initiated Contact with Patient 08/20/16 1233     Chief Complaint  Patient presents with  . Shoulder Pain   (Consider location/radiation/quality/duration/timing/severity/associated sxs/prior Treatment) 41 year old male presents with left shoulder/ upper back pain for the past 10 days. No distinct injury. Has been experiencing pain mostly with movement and will radiate to base of neck. Able to fully move arm/shoulder. Denies any fever, neck pain, chest pain, difficulty breathing, numbness or GI symptoms. Has taken Ibuprofen with minimal relief. No chronic health issues- takes no daily medication.    The history is provided by the patient.    Past Medical History:  Diagnosis Date  . GERD (gastroesophageal reflux disease)    Past Surgical History:  Procedure Laterality Date  . WRIST SURGERY Right    severe laceration   History reviewed. No pertinent family history. Social History  Substance Use Topics  . Smoking status: Current Every Day Smoker    Packs/day: 1.00    Years: 24.00  . Smokeless tobacco: Never Used  . Alcohol use Yes     Comment: social    Review of Systems  Constitutional: Negative for chills, fatigue and fever.  Respiratory: Negative for cough, chest tightness, shortness of breath and wheezing.   Cardiovascular: Negative for chest pain.  Gastrointestinal: Negative for abdominal pain, nausea and vomiting.  Musculoskeletal: Positive for back pain (left thoracic) and myalgias. Negative for joint swelling, neck pain and neck stiffness.  Skin: Negative for color change, rash and wound.  Allergic/Immunologic: Negative for immunocompromised state.  Neurological: Negative for dizziness, tremors, seizures, facial asymmetry, weakness, numbness and headaches.  Hematological: Negative for adenopathy. Does not bruise/bleed easily.    Allergies  Patient has no known allergies.  Home Medications    Prior to Admission medications   Medication Sig Start Date End Date Taking? Authorizing Provider  cyclobenzaprine (FLEXERIL) 10 MG tablet Take 1/2 to 1 tablet by mouth up to 3 times a day as needed for muscle pain/spasms. 08/20/16   Sudie GrumblingAmyot, Ann Berry, NP  diclofenac (VOLTAREN) 75 MG EC tablet Take 1 tablet (75 mg total) by mouth 2 (two) times daily as needed for moderate pain. 08/20/16   Sudie GrumblingAmyot, Ann Berry, NP   Meds Ordered and Administered this Visit  Medications - No data to display  BP (!) 163/95 (BP Location: Left Arm)   Pulse (!) 109   Temp 98.7 F (37.1 C) (Oral)   Resp 20   Ht 5\' 9"  (1.753 m)   Wt 227 lb (103 kg)   SpO2 99%   BMI 33.52 kg/m  No data found.   Physical Exam  Constitutional: He is oriented to person, place, and time. He appears well-developed and well-nourished. No distress.  HENT:  Head: Normocephalic and atraumatic.  Right Ear: External ear normal.  Left Ear: External ear normal.  Eyes: Conjunctivae and EOM are normal.  Neck: Normal range of motion. Neck supple.  Pulmonary/Chest: Effort normal and breath sounds normal.  Musculoskeletal: Normal range of motion. He exhibits tenderness. He exhibits no edema.       Thoracic back: He exhibits tenderness, pain and spasm. He exhibits normal range of motion, no swelling, no edema, no deformity and no laceration.       Back:  Tender along the trapezius and Paraspinous muscles. Has full range of motion of shoulder but pain with extension of arm/shoulder. No tenderness at shoulder joint. No  swelling or erythema seen. No neuro deficits noted.   Neurological: He is alert and oriented to person, place, and time. He has normal strength. No sensory deficit.  Skin: Skin is warm and dry. Capillary refill takes less than 2 seconds. No rash noted. No erythema.  Psychiatric: He has a normal mood and affect. His behavior is normal. Judgment and thought content normal.    Urgent Care Course     Procedures (including  critical care time)  Labs Review Labs Reviewed - No data to display  Imaging Review No results found.   Visual Acuity Review  Right Eye Distance:   Left Eye Distance:   Bilateral Distance:    Right Eye Near:   Left Eye Near:    Bilateral Near:         MDM   1. Strain of left trapezius muscle, initial encounter    Discussed that he has most likely strained a muscle- recommend start Voltaren 75mg  twice a day as needed for pain. Take Flexeril 10mg - 1/2 to 1 tablet up to 3 times a day as needed for muscle spasms- may take a whole tablet at night. Apply warm moist compresses to area for comfort. Note written for work for yesterday. Discussed that his blood pressure may be elevated due to pain. Continue to monitor. Follow-up with his primary care provider in 3 to 4 days if not improving.      Sudie Grumbling, NP 08/21/16 [phone]

## 2016-08-29 ENCOUNTER — Emergency Department
Admission: EM | Admit: 2016-08-29 | Discharge: 2016-08-29 | Disposition: A | Discharge status: Home / Self Care | Payer: BLUE CROSS/BLUE SHIELD | Attending: Emergency Medicine | Admitting: Emergency Medicine

## 2016-08-29 ENCOUNTER — Encounter: Payer: Self-pay | Admitting: Emergency Medicine

## 2016-08-29 ENCOUNTER — Emergency Department: Payer: BLUE CROSS/BLUE SHIELD

## 2016-08-29 DIAGNOSIS — N23 Unspecified renal colic: Secondary | ICD-10-CM | POA: Diagnosis not present

## 2016-08-29 DIAGNOSIS — R109 Unspecified abdominal pain: Secondary | ICD-10-CM | POA: Diagnosis present

## 2016-08-29 DIAGNOSIS — R739 Hyperglycemia, unspecified: Secondary | ICD-10-CM | POA: Diagnosis not present

## 2016-08-29 DIAGNOSIS — F17210 Nicotine dependence, cigarettes, uncomplicated: Secondary | ICD-10-CM | POA: Insufficient documentation

## 2016-08-29 LAB — COMPREHENSIVE METABOLIC PANEL
ALBUMIN: 4.6 g/dL (ref 3.5–5.0)
ALK PHOS: 67 U/L (ref 38–126)
ALT: 42 U/L (ref 17–63)
ANION GAP: 10 (ref 5–15)
AST: 19 U/L (ref 15–41)
BILIRUBIN TOTAL: 1.2 mg/dL (ref 0.3–1.2)
BUN: 10 mg/dL (ref 6–20)
CALCIUM: 9.9 mg/dL (ref 8.9–10.3)
CO2: 22 mmol/L (ref 22–32)
Chloride: 96 mmol/L — ABNORMAL LOW (ref 101–111)
Creatinine, Ser: 0.67 mg/dL (ref 0.61–1.24)
GFR calc Af Amer: >60 mL/min (ref >60)
GFR calc non Af Amer: >60 mL/min (ref >60)
GLUCOSE: 334 mg/dL — AB (ref 65–99)
POTASSIUM: 4.3 mmol/L (ref 3.5–5.1)
SODIUM: 128 mmol/L — AB (ref 135–145)
Total Protein: 8.3 g/dL — ABNORMAL HIGH (ref 6.5–8.1)

## 2016-08-29 LAB — CBC
HEMATOCRIT: 44.5 % (ref 40.0–52.0)
HEMOGLOBIN: 15.3 g/dL (ref 13.0–18.0)
MCH: 27.4 pg (ref 26.0–34.0)
MCHC: 34.3 g/dL (ref 32.0–36.0)
MCV: 79.9 fL — ABNORMAL LOW (ref 80.0–100.0)
Platelets: 328 10*3/uL (ref 150–440)
RBC: 5.57 MIL/uL (ref 4.40–5.90)
RDW: 13.6 % (ref 11.5–14.5)
WBC: 11.5 10*3/uL — ABNORMAL HIGH (ref 3.8–10.6)

## 2016-08-29 LAB — URINALYSIS, COMPLETE (UACMP) WITH MICROSCOPIC
BACTERIA UA: NONE SEEN
Bilirubin Urine: NEGATIVE
Glucose, UA: >=500 mg/dL — AB
Ketones, ur: 20 mg/dL — AB
Leukocytes, UA: NEGATIVE
Nitrite: NEGATIVE
PROTEIN: 100 mg/dL — AB
SPECIFIC GRAVITY, URINE: 1.036 — AB (ref 1.005–1.030)
pH: 6 (ref 5.0–8.0)

## 2016-08-29 LAB — LIPASE, BLOOD: Lipase: 34 U/L (ref 11–51)

## 2016-08-29 MED ORDER — KETOROLAC TROMETHAMINE 30 MG/ML IJ SOLN
30.0000 mg | Freq: Once | INTRAMUSCULAR | Status: AC
  Administered 2016-08-29: 30 mg via INTRAVENOUS
  Filled 2016-08-29: qty 1

## 2016-08-29 MED ORDER — MORPHINE SULFATE (PF) 4 MG/ML IV SOLN
4.0000 mg | Freq: Once | INTRAVENOUS | Status: AC
  Administered 2016-08-29: 4 mg via INTRAVENOUS
  Filled 2016-08-29: qty 1

## 2016-08-29 MED ORDER — ONDANSETRON HCL 4 MG/2ML IJ SOLN
4.0000 mg | Freq: Once | INTRAMUSCULAR | Status: AC
  Administered 2016-08-29: 4 mg via INTRAVENOUS
  Filled 2016-08-29: qty 2

## 2016-08-29 MED ORDER — SODIUM CHLORIDE 0.9 % IV SOLN
Freq: Once | INTRAVENOUS | Status: AC
  Administered 2016-08-29: 18:00:00 via INTRAVENOUS

## 2016-08-29 MED ORDER — METFORMIN HCL 500 MG PO TABS: 500.0000 mg | ORAL_TABLET | Freq: Two times a day (BID) | ORAL | 11 refills | Status: DC

## 2016-08-29 MED ORDER — OXYCODONE-ACETAMINOPHEN 5-325 MG PO TABS: 1.0000 | ORAL_TABLET | Freq: Four times a day (QID) | ORAL | 0 refills | Status: DC | PRN

## 2016-08-29 MED ORDER — POLYETHYLENE GLYCOL 3350 17 G PO PACK: 17.0000 g | PACK | Freq: Every day | ORAL | 0 refills | Status: DC

## 2016-08-29 NOTE — ED Provider Notes (Signed)
[facility] Emergency Department Provider Note       Time seen: ----------------------------------------- 5:34 PM on 08/29/2016 -----------------------------------------     I have reviewed the triage vital signs and the nursing notes.   HISTORY   Chief Complaint Abdominal Pain    HPI Kevin Harris is a 41 y.o. male who presents to the ED for abdominal pain since Saturday and also nausea. Nothing makes symptoms better or worse. Patient also notes he has not had a bowel movement through the same time. And has had decreased oral intake. He denies fevers, chills or other complaints.   Past Medical History:  Diagnosis Date  . GERD (gastroesophageal reflux disease)     There are no active problems to display for this patient.   Past Surgical History:  Procedure Laterality Date  . WRIST SURGERY Right    severe laceration    Allergies Patient has no known allergies.  Social History Social History  Substance Use Topics  . Smoking status: Current Every Day Smoker    Packs/day: 1.00    Years: 24.00  . Smokeless tobacco: Never Used  . Alcohol use Yes     Comment: social    Review of Systems Constitutional: Negative for fever. Eyes: Negative for vision changes ENT:  Negative for congestion, sore throat Cardiovascular: Negative for chest pain. Respiratory: Negative for shortness of breath. Gastrointestinal: Positive for abdominal pain, nausea Genitourinary: Negative for dysuria. Musculoskeletal: Negative for back pain. Skin: Negative for rash. Neurological: Negative for headaches, focal weakness or numbness.  All systems negative/normal/unremarkable except as stated in the HPI  ____________________________________________   PHYSICAL EXAM:  VITAL SIGNS: ED Triage Vitals  Enc Vitals Group     BP 08/29/16 1559 (!) 167/97     Pulse Rate 08/29/16 1559 (!) 109     Resp 08/29/16 1559 20     Temp 08/29/16 1559 98 F (36.7 C)   Temp Source 08/29/16 1559 Oral     SpO2 08/29/16 1559 96 %     Weight 08/29/16 1559 215 lb (97.5 kg)     Height 08/29/16 1559 5\' 9"  (1.753 m)     Head Circumference --      Peak Flow --      Pain Score 08/29/16 1606 8     Pain Loc --      Pain Edu? --      Excl. in GC? --     Constitutional: Alert and oriented. Well appearing and in no distress. Eyes: Conjunctivae are normal. Normal extraocular movements. ENT   Head: Normocephalic and atraumatic.   Nose: No congestion/rhinnorhea.   Mouth/Throat: Mucous membranes are moist.   Neck: No stridor. Cardiovascular: Normal rate, regular rhythm. No murmurs, rubs, or gallops. Respiratory: Normal respiratory effort without tachypnea nor retractions. Breath sounds are clear and equal bilaterally. No wheezes/rales/rhonchi. Gastrointestinal: Soft, nonfocal tenderness, hypoactive bowel sounds Musculoskeletal: Nontender with normal range of motion in extremities. No lower extremity tenderness nor edema. Neurologic:  Normal speech and language. No gross focal neurologic deficits are appreciated.  Skin:  Skin is warm, dry and intact. No rash noted. Psychiatric: Mood and affect are normal. Speech and behavior are normal.  ____________________________________________  ED COURSE:  Pertinent labs & imaging results that were available during my care of the patient were reviewed by me and considered in my medical decision making (see chart for details). Patient presents for abdominal pain and nausea, we will assess with labs and imaging as indicated.   Procedures ____________________________________________  LABS (pertinent positives/negatives)  Labs Reviewed  COMPREHENSIVE METABOLIC PANEL - Abnormal; Notable for the following:       Result Value   Sodium 128 (*)    Chloride 96 (*)    Glucose, Bld 334 (*)    Total Protein 8.3 (*)    All other components within normal limits  CBC - Abnormal; Notable for the following:    WBC 11.5  (*)    MCV 79.9 (*)    All other components within normal limits  URINALYSIS, COMPLETE (UACMP) WITH MICROSCOPIC - Abnormal; Notable for the following:    Color, Urine YELLOW (*)    APPearance CLEAR (*)    Specific Gravity, Urine 1.036 (*)    Glucose, UA >=500 (*)    Hgb urine dipstick LARGE (*)    Ketones, ur 20 (*)    Protein, ur 100 (*)    Squamous Epithelial / LPF 0-5 (*)    All other components within normal limits  LIPASE, BLOOD    RADIOLOGY Images were viewed by me  CT renal protocol IMPRESSION: 1.5 mm proximal left ureteral calculus with fullness of the left intrarenal collecting system.  Hepatic steatosis. ____________________________________________  FINAL ASSESSMENT AND PLAN  Abdominal pain, nausea, hyperglycemia  Plan: Patient's labs and imaging were dictated above. Patient had presented for Abdominal pain which was diffuse. He was found have a kidney stone on CT imaging, this discomfort came by his new onset diabetes and his constipation. He'll be discharged with MiraLAX, pain medicine and metformin. He be referred to primary care for outpatient follow-up.   Emily FilbertWilliams, Jonathan E, MD   Note: This note was generated in part or whole with voice recognition software. Voice recognition is usually quite accurate but there are transcription errors that can and very often do occur. I apologize for any typographical errors that were not detected and corrected.     Emily FilbertWilliams, Jonathan E, MD 08/29/16 [phone]

## 2016-08-29 NOTE — ED Triage Notes (Signed)
Abdominal pain since Saturday.  Also c/o nausea.

## 2016-12-13 ENCOUNTER — Ambulatory Visit
Admission: EM | Admit: 2016-12-13 | Discharge: 2016-12-13 | Disposition: A | Discharge status: Home / Self Care | Payer: BLUE CROSS/BLUE SHIELD | Attending: Family Medicine | Admitting: Family Medicine

## 2016-12-13 ENCOUNTER — Encounter: Payer: Self-pay | Admitting: *Deleted

## 2016-12-13 DIAGNOSIS — Z202 Contact with and (suspected) exposure to infections with a predominantly sexual mode of transmission: Secondary | ICD-10-CM | POA: Diagnosis not present

## 2016-12-13 DIAGNOSIS — R300 Dysuria: Secondary | ICD-10-CM | POA: Diagnosis not present

## 2016-12-13 DIAGNOSIS — R369 Urethral discharge, unspecified: Secondary | ICD-10-CM | POA: Diagnosis not present

## 2016-12-13 HISTORY — DX: Type 2 diabetes mellitus without complications: E11.9

## 2016-12-13 LAB — URINALYSIS, COMPLETE (UACMP) WITH MICROSCOPIC
BACTERIA UA: NONE SEEN
Bilirubin Urine: NEGATIVE
GLUCOSE, UA: NEGATIVE mg/dL
Ketones, ur: NEGATIVE mg/dL
Leukocytes, UA: NEGATIVE
Nitrite: NEGATIVE
PH: 6.5 (ref 5.0–8.0)
Protein, ur: NEGATIVE mg/dL
SPECIFIC GRAVITY, URINE: 1.02 (ref 1.005–1.030)
Squamous Epithelial / LPF: NONE SEEN

## 2016-12-13 LAB — CHLAMYDIA/NGC RT PCR (ARMC ONLY)
Chlamydia Tr: NOT DETECTED
N GONORRHOEAE: NOT DETECTED

## 2016-12-13 MED ORDER — CEFTRIAXONE SODIUM 250 MG IJ SOLR
250.0000 mg | Freq: Once | INTRAMUSCULAR | Status: AC
  Administered 2016-12-13: 250 mg via INTRAMUSCULAR

## 2016-12-13 MED ORDER — AZITHROMYCIN 500 MG PO TABS
1000.0000 mg | ORAL_TABLET | Freq: Once | ORAL | Status: AC
  Administered 2016-12-13: 1000 mg via ORAL

## 2016-12-13 NOTE — ED Provider Notes (Signed)
[facility]    CSN: [id number] Arrival date & time: 12/13/16  1008  History   Chief Complaint Chief Complaint  Patient presents with  . Dysuria  . Exposure to STD   HPI  41 year old male with type 2 diabetes presents with the above complaints.   Patient reports that he had unprotected intercourse on Thursday. A few days after, he developed "tingling" and dysuria. No reports of fevers or chills. No flank pain. No hematuria. He is concerned about STD exposure. No reports of lymphadenopathy, although he has had some pain in the left inguinal region. He's had some "dripping" from his penis. No other associated symptoms. No other complaints or concerns at this time.  Past Medical History:  Diagnosis Date  . Diabetes mellitus without complication (HCC)   . GERD (gastroesophageal reflux disease)     Past Surgical History:  Procedure Laterality Date  . WRIST SURGERY Right    severe laceration    Home Medications    Prior to Admission medications   Medication Sig Start Date End Date Taking? Authorizing Provider  metFORMIN (GLUCOPHAGE) 500 MG tablet Take 1 tablet (500 mg total) by mouth 2 (two) times daily with a meal. 08/29/16 08/29/17 Yes Emily Filbert, MD  cyclobenzaprine (FLEXERIL) 10 MG tablet Take 1/2 to 1 tablet by mouth up to 3 times a day as needed for muscle pain/spasms. 08/20/16   Sudie Grumbling, NP  diclofenac (VOLTAREN) 75 MG EC tablet Take 1 tablet (75 mg total) by mouth 2 (two) times daily as needed for moderate pain. 08/20/16   Sudie Grumbling, NP  oxyCODONE-acetaminophen (PERCOCET) 5-325 MG tablet Take 1-2 tablets by mouth every 6 (six) hours as needed. 08/29/16   Emily Filbert, MD  polyethylene glycol (MIRALAX / Ethelene Hal) packet Take 17 g by mouth daily. 08/29/16   Emily Filbert, MD    Family History History reviewed. No pertinent family history.  Social History Social History  Substance Use Topics  . Smoking status: Current Every  Day Smoker    Packs/day: 1.00    Years: 24.00  . Smokeless tobacco: Never Used  . Alcohol use Yes     Comment: social   Allergies   Patient has no known allergies.   Review of Systems Review of Systems  Constitutional: Negative.   Gastrointestinal: Negative for abdominal pain.  Genitourinary: Positive for discharge, dysuria and penile pain. Negative for flank pain and hematuria.   Physical Exam Triage Vital Signs ED Triage Vitals  Enc Vitals Group     BP 12/13/16 1033 139/84     Pulse Rate 12/13/16 1033 91     Resp 12/13/16 1033 16     Temp 12/13/16 1033 98.1 F (36.7 C)     Temp Source 12/13/16 1033 Oral     SpO2 12/13/16 1033 100 %     Weight 12/13/16 1035 208 lb (94.3 kg)     Height 12/13/16 1035  (1.778 m)     Head Circumference --      Peak Flow --      Pain Score --      Pain Loc --      Pain Edu? --      Excl. in GC? --    Updated Vital Signs BP 139/84 (BP Location: Left Arm)   Pulse 91   Temp 98.1 F (36.7 C) (Oral)   Resp 16   Ht  (1.778 m)   Wt 208 lb (94.3 kg)  SpO2 100%   BMI 29.84 kg/m   Physical Exam  Constitutional: He is oriented to person, place, and time. He appears well-developed. No distress.  Cardiovascular: Normal rate and regular rhythm.   No murmur heard. Pulmonary/Chest: Effort normal and breath sounds normal. He has no wheezes. He has no rales.  Abdominal: Soft. He exhibits no distension. There is no tenderness. No hernia.  Genitourinary: Testes normal and penis normal. Circumcised.  Lymphadenopathy: No inguinal adenopathy noted on the left side.  Neurological: He is alert and oriented to person, place, and time.  Psychiatric: He has a normal mood and affect.  Vitals reviewed.  UC Treatments / Results  Labs (all labs ordered are listed, but only abnormal results are displayed) Labs Reviewed  URINALYSIS, COMPLETE (UACMP) WITH MICROSCOPIC - Abnormal; Notable for the following:       Result Value   Hgb urine  dipstick TRACE (*)    All other components within normal limits  CHLAMYDIA/NGC RT PCR [facility] ONLY)   EKG  EKG Interpretation None       Radiology No results found.  Procedures Procedures (including critical care time)  Medications Ordered in UC Medications  azithromycin (ZITHROMAX) tablet 1,000 mg (1,000 mg Oral Given 12/13/16 1105)  cefTRIAXone (ROCEPHIN) injection 250 mg (250 mg Intramuscular Given 12/13/16 1103)   Initial Impression / Assessment and Plan / UC Course  I have reviewed the triage vital signs and the nursing notes.  Pertinent labs & imaging results that were available during my care of the patient were reviewed by me and considered in my medical decision making (see chart for details).   41 year old male presents with dysuria. Given recent unprotected intercourse and history, I am treating him empirically for STD with Rocephin and Azithromycin. Awaiting GC/Chlamydia. Declined HIV/RPR testing.   Final Clinical Impressions(s) / UC Diagnoses   Final diagnoses:  Possible exposure to STD  Dysuria   New Prescriptions Discharge Medication List as of 12/13/2016 11:15 AM     Controlled Substance Prescriptions [place] Controlled Substance Registry consulted? Not Applicable   Tommie Sams, DO 12/13/16 1121

## 2016-12-13 NOTE — ED Triage Notes (Signed)
Dysuria, and possible STD exposure.

## 2016-12-13 NOTE — Discharge Instructions (Signed)
We will call with the results if they are positive.  Take care  Dr. Adriana Simas

## 2017-07-16 ENCOUNTER — Ambulatory Visit: Payer: BLUE CROSS/BLUE SHIELD

## 2017-07-16 ENCOUNTER — Encounter: Payer: Self-pay | Admitting: Gynecology

## 2017-07-16 ENCOUNTER — Other Ambulatory Visit: Payer: Self-pay

## 2017-07-16 ENCOUNTER — Ambulatory Visit
Admission: EM | Admit: 2017-07-16 | Discharge: 2017-07-16 | Disposition: A | Discharge status: Home / Self Care | Payer: BLUE CROSS/BLUE SHIELD | Attending: Family Medicine | Admitting: Family Medicine

## 2017-07-16 DIAGNOSIS — M25441 Effusion, right hand: Secondary | ICD-10-CM | POA: Diagnosis not present

## 2017-07-16 DIAGNOSIS — M79641 Pain in right hand: Secondary | ICD-10-CM

## 2017-07-16 MED ORDER — METHYLPREDNISOLONE 4 MG PO TBPK: ORAL_TABLET | ORAL | 0 refills | Status: DC

## 2017-07-16 NOTE — ED Triage Notes (Addendum)
Per patient x over a month fell and his right index and ring finger painful

## 2017-07-16 NOTE — ED Provider Notes (Signed)
[facility]    CSN: [id number] Arrival date & time: 07/16/17  1516     History   Chief Complaint Chief Complaint  Patient presents with  . Finger Injury    HPI Kevin Harris is a 42 y.o. male presents to the urgent care facility for evaluation of right hand third and fourth digit PIP joint pain.  Symptoms have been present for 4 weeks.  Patient states 1 month ago he fell and is uncertain how exactly he landed on his right hand but states he has had pain and swelling to the right third and fourth PIP joints.  He states he is taken occasional over-the-counter anti-inflammatory medications with no improvement.  He is continue to work.  He denies any numbness or tingling.  No warmth or redness.  He states his pain is moderate.   HPI  Past Medical History:  Diagnosis Date  . Diabetes mellitus without complication (HCC)   . GERD (gastroesophageal reflux disease)     There are no active problems to display for this patient.   Past Surgical History:  Procedure Laterality Date  . WRIST SURGERY Right    severe laceration       Home Medications    Prior to Admission medications   Medication Sig Start Date End Date Taking? Authorizing Provider  glipiZIDE (GLUCOTROL XL) 5 MG 24 hr tablet Take by mouth. 09/02/16 09/02/17 Yes [provider]  metFORMIN (GLUCOPHAGE) 500 MG tablet Take 1 tablet (500 mg total) by mouth 2 (two) times daily with a meal. 08/29/16 08/29/17 Yes Emily Filbert, MD  omeprazole (PRILOSEC OTC) 20 MG tablet Take by mouth. 07/27/12  Yes [provider]  cyclobenzaprine (FLEXERIL) 10 MG tablet Take 1/2 to 1 tablet by mouth up to 3 times a day as needed for muscle pain/spasms. 08/20/16   Sudie Grumbling, NP  diclofenac (VOLTAREN) 75 MG EC tablet Take 1 tablet (75 mg total) by mouth 2 (two) times daily as needed for moderate pain. 08/20/16   Sudie Grumbling, NP  methylPREDNISolone (MEDROL DOSEPAK) 4 MG TBPK tablet Take Medrol Dosepak as  directed 6-day 07/16/17   Evon Slack, PA-C  oxyCODONE-acetaminophen (PERCOCET) 5-325 MG tablet Take 1-2 tablets by mouth every 6 (six) hours as needed. 08/29/16   Emily Filbert, MD  polyethylene glycol (MIRALAX / Ethelene Hal) packet Take 17 g by mouth daily. 08/29/16   Emily Filbert, MD    Family History History reviewed. No pertinent family history.  Social History Social History   Tobacco Use  . Smoking status: Current Every Day Smoker    Packs/day: 1.00    Years: 24.00    Pack years: 24.00  . Smokeless tobacco: Never Used  Substance Use Topics  . Alcohol use: Yes    Comment: social  . Drug use: Yes    Types: Marijuana     Allergies   Patient has no known allergies.   Review of Systems Review of Systems  Constitutional: Negative for fever.  Respiratory: Negative for shortness of breath.   Cardiovascular: Negative for chest pain.  Gastrointestinal: Negative for abdominal pain.  Genitourinary: Negative for difficulty urinating, dysuria and urgency.  Musculoskeletal: Positive for arthralgias and joint swelling. Negative for myalgias.  Skin: Negative for rash.  Neurological: Negative for dizziness, numbness and headaches.     Physical Exam Triage Vital Signs ED Triage Vitals  Enc Vitals Group     BP 07/16/17 1530 (!) 141/96     Pulse  Rate 07/16/17 1530 99     Resp 07/16/17 1530 18     Temp 07/16/17 1530 98.4 F (36.9 C)     Temp Source 07/16/17 1530 Oral     SpO2 07/16/17 1530 100 %     Weight 07/16/17 1527 212 lb (96.2 kg)     Height --      Head Circumference --      Peak Flow --      Pain Score 07/16/17 1527 0     Pain Loc --      Pain Edu? --      Excl. in GC? --    No data found.  Updated Vital Signs BP (!) 141/96 (BP Location: Left Arm)   Pulse 99   Temp 98.4 F (36.9 C) (Oral)   Resp 18   Wt 212 lb (96.2 kg)   SpO2 100%   BMI 30.42 kg/m   Visual Acuity Right Eye Distance:   Left Eye Distance:   Bilateral Distance:     Right Eye Near:   Left Eye Near:    Bilateral Near:     Physical Exam  Constitutional: He is oriented to person, place, and time. He appears well-developed and well-nourished.  HENT:  Head: Normocephalic and atraumatic.  Eyes: Conjunctivae are normal.  Neck: Normal range of motion.  Cardiovascular: Normal rate.  Pulmonary/Chest: Effort normal. No respiratory distress.  Musculoskeletal:  Examination of the right hand shows patient has swelling along the PIP joints of the right third and fourth digits.  He is able to make a full fist but has pain on the PIP joints of flexion.  He is able to maintain full active extension of the digits.  There is no catching triggering locking.  Sensation is intact distally.  There is no warmth or redness throughout the digits.  Neurological: He is alert and oriented to person, place, and time.  Skin: Skin is warm. No rash noted.  Psychiatric: He has a normal mood and affect. His behavior is normal. Thought content normal.     UC Treatments / Results  Labs (all labs ordered are listed, but only abnormal results are displayed) Labs Reviewed - No data to display  EKG None  Radiology Dg Hand Complete Right  Result Date: 07/16/2017 CLINICAL DATA:  Pt fell a month ago and still has pain and swelling 3rd and 4th fingers EXAM: RIGHT HAND - COMPLETE 3+ VIEW COMPARISON:  None. FINDINGS: No evidence of fracture of the carpal or metacarpal bones. Radiocarpal joint is intact. Phalanges are normal. No soft tissue injury. IMPRESSION: No fracture or dislocation. Electronically Signed   By: Genevive Bi M.D.   On: 07/16/2017 15:53    Procedures Procedures (including critical care time)  Medications Ordered in UC Medications - No data to display  Initial Impression / Assessment and Plan / UC Course  I have reviewed the triage vital signs and the nursing notes.  Pertinent labs & imaging results that were available during my care of the patient were  reviewed by me and considered in my medical decision making (see chart for details).     42 year old male with swelling to the right third and fourth PIP joints.  X-rays show no evidence of acute bony abnormality.  I suspect patient sprained these joints and has had continued swelling throughout the PIP joint.  At 4 weeks out I would recommend trying a Medrol Dosepak followed by ibuprofen.  There is no tendon deficits noted.  Patient will  follow with orthopedics if no improvement in 1 week. Final Clinical Impressions(s) / UC Diagnoses   Final diagnoses:  Right hand pain  Swelling of finger joint, right     Discharge Instructions     Please ice swollen digits of the right hand 20 minutes every hour after activity.  Take Medrol Dosepak as prescribed.  After completing Medrol Dosepak would recommend ibuprofen as needed.  Follow-up with orthopedics if no improvement in 1 week.    ED Prescriptions    Medication Sig Dispense Auth. Provider   methylPREDNISolone (MEDROL DOSEPAK) 4 MG TBPK tablet Take Medrol Dosepak as directed 6-day 21 tablet Ronnette Juniper        Evon Slack, [place] 07/16/17 1607

## 2017-07-16 NOTE — Discharge Instructions (Addendum)
Please ice swollen digits of the right hand 20 minutes every hour after activity.  Take Medrol Dosepak as prescribed.  After completing Medrol Dosepak would recommend ibuprofen as needed.  Follow-up with orthopedics if no improvement in 1 week.

## 2018-03-29 ENCOUNTER — Ambulatory Visit (INDEPENDENT_AMBULATORY_CARE_PROVIDER_SITE_OTHER)
Admission: EM | Admit: 2018-03-29 | Discharge: 2018-03-29 | Disposition: A | Discharge status: Nursing Home (Includes ICF) | Payer: BLUE CROSS/BLUE SHIELD | Source: Home / Self Care | Attending: Emergency Medicine | Admitting: Emergency Medicine

## 2018-03-29 DIAGNOSIS — K5900 Constipation, unspecified: Secondary | ICD-10-CM | POA: Diagnosis present

## 2018-03-29 DIAGNOSIS — K760 Fatty (change of) liver, not elsewhere classified: Secondary | ICD-10-CM | POA: Diagnosis present

## 2018-03-29 DIAGNOSIS — K2980 Duodenitis without bleeding: Secondary | ICD-10-CM | POA: Diagnosis not present

## 2018-03-29 DIAGNOSIS — K219 Gastro-esophageal reflux disease without esophagitis: Secondary | ICD-10-CM | POA: Diagnosis present

## 2018-03-29 DIAGNOSIS — E871 Hypo-osmolality and hyponatremia: Secondary | ICD-10-CM | POA: Diagnosis present

## 2018-03-29 DIAGNOSIS — K859 Acute pancreatitis without necrosis or infection, unspecified: Principal | ICD-10-CM | POA: Diagnosis present

## 2018-03-29 DIAGNOSIS — Z833 Family history of diabetes mellitus: Secondary | ICD-10-CM

## 2018-03-29 DIAGNOSIS — Z79899 Other long term (current) drug therapy: Secondary | ICD-10-CM | POA: Diagnosis not present

## 2018-03-29 DIAGNOSIS — Z7984 Long term (current) use of oral hypoglycemic drugs: Secondary | ICD-10-CM | POA: Diagnosis not present

## 2018-03-29 DIAGNOSIS — R109 Unspecified abdominal pain: Secondary | ICD-10-CM | POA: Insufficient documentation

## 2018-03-29 DIAGNOSIS — R631 Polydipsia: Secondary | ICD-10-CM | POA: Diagnosis not present

## 2018-03-29 DIAGNOSIS — F1010 Alcohol abuse, uncomplicated: Secondary | ICD-10-CM | POA: Diagnosis present

## 2018-03-29 DIAGNOSIS — F17210 Nicotine dependence, cigarettes, uncomplicated: Secondary | ICD-10-CM | POA: Diagnosis present

## 2018-03-29 DIAGNOSIS — E1165 Type 2 diabetes mellitus with hyperglycemia: Secondary | ICD-10-CM | POA: Diagnosis not present

## 2018-03-29 DIAGNOSIS — R112 Nausea with vomiting, unspecified: Secondary | ICD-10-CM | POA: Diagnosis present

## 2018-03-29 LAB — GLUCOSE, CAPILLARY: Glucose-Capillary: 255 mg/dL — ABNORMAL HIGH (ref 70–99)

## 2018-03-29 MED ORDER — ONDANSETRON 8 MG PO TBDP
8.0000 mg | ORAL_TABLET | Freq: Once | ORAL | Status: AC
  Administered 2018-03-29: 8 mg via ORAL

## 2018-03-29 MED ORDER — KETOROLAC TROMETHAMINE 60 MG/2ML IM SOLN
30.0000 mg | Freq: Once | INTRAMUSCULAR | Status: AC
  Administered 2018-03-29: 30 mg via INTRAMUSCULAR

## 2018-03-29 NOTE — ED Provider Notes (Signed)
HPI  SUBJECTIVE:  Kevin Harris is a 43 y.o. male who presents with constant, stabbing, nonmigratory, nonradiating right upper quadrant, right flank right lower quadrant and left lower quadrant pain starting at 3 AM today.  States that he ate some chicken that was meant to be dog food last night.  He also drank half a pint of tequila.  His abdominal pain is getting worse. Reports nausea, vomiting, anorexia.  Last bowel movement was yesterday. He also reports polyuria, polydipsia, 6 pound unintentional weight loss over the past 2 or 3 days.  States that he does not check his sugar at home, and that he was taken off his diabetic medication.  He denies fevers, abdominal distention, back pain, dysuria, urgency, cloudy odorous urine, hematuria.  No chest pain, shortness of breath.  He states that he has had abdominal pain like this before and it was found to be kidney stones.  He tried Pepto-Bismol, Alka-Seltzer without improvement of symptoms.  No aggravating factors.  His abdominal pain is not associated with movement.  States the car ride over here was not painful.    He also reports decreased hearing, left ear feeling "clogged".  He wears ear buds.  He reported pain initially and some otorrhea, but this is resolved.  Denies Q-tip insertion.  It is not associated with chewing or yawning.  Reports constant nasal congestion.  He tried peroxide without improvement in symptoms.  No aggravating factors.  He has a past medical history of diabetes nonobstructing nephrolithiasis.  No history of DKA, gallbladder disease, abdominal surgeries, hypertension, UTI, pyelonephritis, pancreatitis.  No history of otitis media.  TUU:[id number], Arlyss Repress, NP   Past Medical History:  Diagnosis Date  . Diabetes mellitus without complication (HCC)   . GERD (gastroesophageal reflux disease)     Past Surgical History:  Procedure Laterality Date  . WRIST SURGERY Right    severe laceration    Family History  Problem  Relation Age of Onset  . Healthy Mother   . Healthy Father     Social History   Tobacco Use  . Smoking status: Current Every Day Smoker    Packs/day: 1.00    Years: 24.00    Pack years: 24.00  . Smokeless tobacco: Never Used  Substance Use Topics  . Alcohol use: Yes    Comment: social  . Drug use: Yes    Types: Marijuana     Current Facility-Administered Medications:  .  ketorolac (TORADOL) injection 30 mg, 30 mg, Intramuscular, Once, Domenick Gong, MD .  ondansetron (ZOFRAN-ODT) disintegrating tablet 8 mg, 8 mg, Oral, Once, Domenick Gong, MD  Current Outpatient Medications:  .  omeprazole (PRILOSEC OTC) 20 MG tablet, Take by mouth., Disp: , Rfl:  .  cyclobenzaprine (FLEXERIL) 10 MG tablet, Take 1/2 to 1 tablet by mouth up to 3 times a day as needed for muscle pain/spasms., Disp: 15 tablet, Rfl: 0 .  diclofenac (VOLTAREN) 75 MG EC tablet, Take 1 tablet (75 mg total) by mouth 2 (two) times daily as needed for moderate pain., Disp: 20 tablet, Rfl: 0 .  glipiZIDE (GLUCOTROL XL) 5 MG 24 hr tablet, Take by mouth., Disp: , Rfl:  .  metFORMIN (GLUCOPHAGE) 500 MG tablet, Take 1 tablet (500 mg total) by mouth 2 (two) times daily with a meal., Disp: 60 tablet, Rfl: 11 .  polyethylene glycol (MIRALAX / GLYCOLAX) packet, Take 17 g by mouth daily., Disp: 14 each, Rfl: 0  No Known Allergies   ROS  As  noted in HPI.   Physical Exam  BP (!) 148/111 (BP Location: Left Arm)   Pulse (!) 124   Temp 99.8 F (37.7 C) (Oral)   Resp 18   Ht 5\' 9"  (1.753 m)   Wt 97.1 kg   SpO2 100%   BMI 31.60 kg/m   Constitutional: Well developed, well nourished, appears uncomfortable. Eyes:  EOMI, conjunctiva normal bilaterally HENT: Normocephalic, atraumatic,mucus membranes moist.  Left external ear, external ear canal normal.  TMs normal bilaterally. Respiratory: Normal inspiratory effort lungs clear bilaterally Cardiovascular: Tachycardia, no murmurs, rubs, gallops. GI: Normal appearance,  soft, nondistended.  Positive right upper quadrant, right flank tenderness.  Negative murphy, McBurney. hypoactive bowel sounds.  No guarding, rebound.  Negative tap table test. Back: No CVAT. skin: No rash, skin intact Musculoskeletal: no deformities Neurologic: Alert & oriented x 3, no focal neuro deficits Psychiatric: Speech and behavior appropriate   ED Course   Medications  ketorolac (TORADOL) injection 30 mg (has no administration in time range)  ondansetron (ZOFRAN-ODT) disintegrating tablet 8 mg (has no administration in time range)    Orders Placed This Encounter  Procedures  . Glucose, capillary    Standing Status:   Standing    Number of Occurrences:   1  . CBG monitoring, ED    Standing Status:   Standing    Number of Occurrences:   1    Results for orders placed or performed during the hospital encounter of 03/29/18 (from the past 24 hour(s))  Glucose, capillary     Status: Abnormal   Collection Time: 03/29/18  9:26 PM  Result Value Ref Range   Glucose-Capillary 255 (H) 70 - 99 mg/dL   No results found.  ED Clinical Impression  Abdominal pain, unspecified abdominal location   ED Assessment/Plan  Patient appears uncomfortable, is tachycardic, hypertensive.  He has right upper quadrant and right flank tenderness.  This could be from the chicken that he ate, however, concern for other cause of his symptoms such as gallbladder disease, nephrolithiasis, obstruction, perforation, pancreatitis.  Patient is slightly hyperglycemic, but doubt DKA.  Giving Toradol 30 mg IM, Zofran 8 mg p.o.  Transferring to the [facility] ED for comprehensive evaluation, IV fluids, pain control.  Feel that he is stable to go by private vehicle.  Discussed labs, MDM, and rationale for transfer to the emergency department.  Advised him to not have anything to eat or drink until his ER evaluation is complete.  Patient and spouse agree with plan.  Meds ordered this encounter  Medications  .  ketorolac (TORADOL) injection 30 mg  . ondansetron (ZOFRAN-ODT) disintegrating tablet 8 mg    *This clinic note was created using Scientist, clinical (histocompatibility and immunogenetics). Therefore, there may be occasional mistakes despite careful proofreading.   ?   Domenick Gong, MD 03/29/18 2205

## 2018-03-29 NOTE — ED Triage Notes (Addendum)
Pt here for abdominal pain that started this morning, cannot eat or have a bowel movement due to the pain. Complains of nausea. Did take Pepto bismol and alka seltzer. Started Saturday with left ear pain

## 2018-03-29 NOTE — Discharge Instructions (Addendum)
Go to the [facility] ED right now for a comprehensive evaluation, pain control and IV fluids.  Let them know if your abdominal pain changes, gets worse.  Do not have anything to eat or drink until your ER evaluation is complete.

## 2018-03-30 ENCOUNTER — Observation Stay
Admission: EM | Admit: 2018-03-30 | Discharge: 2018-03-31 | DRG: 439 | Discharge status: Against Medical Advice | Payer: BLUE CROSS/BLUE SHIELD | Attending: Internal Medicine | Admitting: Internal Medicine

## 2018-03-30 ENCOUNTER — Encounter: Payer: Self-pay | Admitting: Emergency Medicine

## 2018-03-30 ENCOUNTER — Emergency Department: Payer: BLUE CROSS/BLUE SHIELD

## 2018-03-30 ENCOUNTER — Other Ambulatory Visit: Payer: Self-pay

## 2018-03-30 DIAGNOSIS — K859 Acute pancreatitis without necrosis or infection, unspecified: Secondary | ICD-10-CM | POA: Diagnosis present

## 2018-03-30 DIAGNOSIS — K2980 Duodenitis without bleeding: Secondary | ICD-10-CM

## 2018-03-30 DIAGNOSIS — R109 Unspecified abdominal pain: Secondary | ICD-10-CM

## 2018-03-30 LAB — URINALYSIS, COMPLETE (UACMP) WITH MICROSCOPIC
Bacteria, UA: NONE SEEN
Bilirubin Urine: NEGATIVE
Glucose, UA: >=500 mg/dL — AB
KETONES UR: 20 mg/dL — AB
LEUKOCYTES UA: NEGATIVE
NITRITE: NEGATIVE
PROTEIN: NEGATIVE mg/dL
Specific Gravity, Urine: 1.021 (ref 1.005–1.030)
pH: 6 (ref 5.0–8.0)

## 2018-03-30 LAB — COMPREHENSIVE METABOLIC PANEL
ALT: 32 U/L (ref 0–44)
AST: 20 U/L (ref 15–41)
Albumin: 4.5 g/dL (ref 3.5–5.0)
Alkaline Phosphatase: 53 U/L (ref 38–126)
Anion gap: 10 (ref 5–15)
BUN: 10 mg/dL (ref 6–20)
CO2: 24 mmol/L (ref 22–32)
Calcium: 9.2 mg/dL (ref 8.9–10.3)
Chloride: 95 mmol/L — ABNORMAL LOW (ref 98–111)
Creatinine, Ser: 0.84 mg/dL (ref 0.61–1.24)
GFR calc non Af Amer: >60 mL/min (ref >60)
Glucose, Bld: 315 mg/dL — ABNORMAL HIGH (ref 70–99)
POTASSIUM: 3.8 mmol/L (ref 3.5–5.1)
Sodium: 129 mmol/L — ABNORMAL LOW (ref 135–145)
Total Bilirubin: 0.9 mg/dL (ref 0.3–1.2)
Total Protein: 7.5 g/dL (ref 6.5–8.1)

## 2018-03-30 LAB — GLUCOSE, CAPILLARY
Glucose-Capillary: 276 mg/dL — ABNORMAL HIGH (ref 70–99)
Glucose-Capillary: 244 mg/dL — ABNORMAL HIGH (ref 70–99)
Glucose-Capillary: 202 mg/dL — ABNORMAL HIGH (ref 70–99)

## 2018-03-30 LAB — CBC WITH DIFFERENTIAL/PLATELET
Abs Immature Granulocytes: 0.05 10*3/uL (ref 0.00–0.07)
Basophils Absolute: 0 10*3/uL (ref 0.0–0.1)
Basophils Relative: 0 %
EOS ABS: 0.1 10*3/uL (ref 0.0–0.5)
Eosinophils Relative: 1 %
HCT: 43.4 % (ref 39.0–52.0)
Hemoglobin: 15.2 g/dL (ref 13.0–17.0)
Immature Granulocytes: 0 %
LYMPHS ABS: 1.4 10*3/uL (ref 0.7–4.0)
Lymphocytes Relative: 11 %
MCH: 27.9 pg (ref 26.0–34.0)
MCHC: 35 g/dL (ref 30.0–36.0)
MCV: 79.8 fL — ABNORMAL LOW (ref 80.0–100.0)
Monocytes Absolute: 0.8 10*3/uL (ref 0.1–1.0)
Monocytes Relative: 7 %
NRBC: 0 % (ref 0.0–0.2)
Neutro Abs: 9.7 10*3/uL — ABNORMAL HIGH (ref 1.7–7.7)
Neutrophils Relative %: 81 %
Platelets: 314 10*3/uL (ref 150–400)
RBC: 5.44 MIL/uL (ref 4.22–5.81)
RDW: 14.1 % (ref 11.5–15.5)
WBC: 12 10*3/uL — ABNORMAL HIGH (ref 4.0–10.5)

## 2018-03-30 LAB — LIPASE, BLOOD: Lipase: 37 U/L (ref 11–51)

## 2018-03-30 LAB — URINE DRUG SCREEN, QUALITATIVE (ARMC ONLY)
Amphetamines, Ur Screen: NOT DETECTED
Barbiturates, Ur Screen: NOT DETECTED
Benzodiazepine, Ur Scrn: NOT DETECTED
Cannabinoid 50 Ng, Ur ~~LOC~~: NOT DETECTED
Cocaine Metabolite,Ur ~~LOC~~: NOT DETECTED
MDMA (Ecstasy)Ur Screen: NOT DETECTED
Methadone Scn, Ur: NOT DETECTED
OPIATE, UR SCREEN: NOT DETECTED
Phencyclidine (PCP) Ur S: NOT DETECTED
Tricyclic, Ur Screen: NOT DETECTED

## 2018-03-30 LAB — HEMOGLOBIN A1C
Hgb A1c MFr Bld: 8.7 % — ABNORMAL HIGH (ref 4.8–5.6)
Mean Plasma Glucose: 202.99 mg/dL

## 2018-03-30 LAB — PROCALCITONIN: Procalcitonin: <0.1 ng/mL

## 2018-03-30 MED ORDER — SODIUM CHLORIDE 0.9 % IV SOLN
INTRAVENOUS | Status: DC
  Administered 2018-03-30 – 2018-03-31 (×2): via INTRAVENOUS

## 2018-03-30 MED ORDER — MORPHINE SULFATE (PF) 2 MG/ML IV SOLN
2.0000 mg | Freq: Once | INTRAVENOUS | Status: AC
  Administered 2018-03-30: 2 mg via INTRAVENOUS
  Filled 2018-03-30: qty 1

## 2018-03-30 MED ORDER — PANTOPRAZOLE SODIUM 40 MG IV SOLR
40.0000 mg | Freq: Two times a day (BID) | INTRAVENOUS | Status: DC
  Administered 2018-03-30: 40 mg via INTRAVENOUS
  Filled 2018-03-30 (×2): qty 40

## 2018-03-30 MED ORDER — KETOROLAC TROMETHAMINE 30 MG/ML IJ SOLN
30.0000 mg | Freq: Four times a day (QID) | INTRAMUSCULAR | Status: DC | PRN
  Administered 2018-03-30 – 2018-03-31 (×2): 30 mg via INTRAVENOUS
  Filled 2018-03-30 (×2): qty 1

## 2018-03-30 MED ORDER — SODIUM CHLORIDE 0.9 % IV BOLUS
1000.0000 mL | Freq: Once | INTRAVENOUS | Status: AC
  Administered 2018-03-30: 1000 mL via INTRAVENOUS

## 2018-03-30 MED ORDER — ONDANSETRON HCL 4 MG/2ML IJ SOLN
4.0000 mg | Freq: Once | INTRAMUSCULAR | Status: AC
  Administered 2018-03-30: 4 mg via INTRAVENOUS
  Filled 2018-03-30: qty 2

## 2018-03-30 MED ORDER — HYDROMORPHONE HCL 1 MG/ML IJ SOLN
0.5000 mg | Freq: Once | INTRAMUSCULAR | Status: AC
  Administered 2018-03-30: 0.5 mg via INTRAVENOUS
  Filled 2018-03-30: qty 1

## 2018-03-30 MED ORDER — ACETAMINOPHEN 325 MG PO TABS: 650.0000 mg | ORAL_TABLET | Freq: Four times a day (QID) | ORAL | Status: DC | PRN

## 2018-03-30 MED ORDER — PANTOPRAZOLE SODIUM 40 MG IV SOLR
40.0000 mg | Freq: Once | INTRAVENOUS | Status: AC
  Administered 2018-03-30: 40 mg via INTRAVENOUS
  Filled 2018-03-30: qty 40

## 2018-03-30 MED ORDER — POLYETHYLENE GLYCOL 3350 17 G PO PACK: 17.0000 g | PACK | Freq: Every day | ORAL | Status: DC

## 2018-03-30 MED ORDER — INSULIN ASPART 100 UNIT/ML ~~LOC~~ SOLN
0.0000 [IU] | Freq: Every day | SUBCUTANEOUS | Status: DC
  Administered 2018-03-30: 2 [IU] via SUBCUTANEOUS
  Filled 2018-03-30: qty 1

## 2018-03-30 MED ORDER — TRAMADOL HCL 50 MG PO TABS: 50.0000 mg | ORAL_TABLET | Freq: Four times a day (QID) | ORAL | Status: DC | PRN

## 2018-03-30 MED ORDER — ONDANSETRON HCL 4 MG PO TABS: 4.0000 mg | ORAL_TABLET | Freq: Four times a day (QID) | ORAL | Status: DC | PRN

## 2018-03-30 MED ORDER — ACETAMINOPHEN 650 MG RE SUPP: 650.0000 mg | Freq: Four times a day (QID) | RECTAL | Status: DC | PRN

## 2018-03-30 MED ORDER — SODIUM CHLORIDE 0.9 % IV SOLN
1.0000 g | Freq: Once | INTRAVENOUS | Status: AC
  Administered 2018-03-30: 1 g via INTRAVENOUS
  Filled 2018-03-30: qty 10

## 2018-03-30 MED ORDER — IOPAMIDOL (ISOVUE-300) INJECTION 61%
100.0000 mL | Freq: Once | INTRAVENOUS | Status: AC | PRN
  Administered 2018-03-30: 100 mL via INTRAVENOUS

## 2018-03-30 MED ORDER — ONDANSETRON HCL 4 MG/2ML IJ SOLN: 4.0000 mg | Freq: Four times a day (QID) | INTRAMUSCULAR | Status: DC | PRN

## 2018-03-30 MED ORDER — ENOXAPARIN SODIUM 40 MG/0.4ML ~~LOC~~ SOLN
40.0000 mg | SUBCUTANEOUS | Status: DC
  Filled 2018-03-30: qty 0.4

## 2018-03-30 MED ORDER — IOPAMIDOL (ISOVUE-300) INJECTION 61%
30.0000 mL | Freq: Once | INTRAVENOUS | Status: AC | PRN
  Administered 2018-03-30: 30 mL via ORAL

## 2018-03-30 MED ORDER — INSULIN ASPART 100 UNIT/ML ~~LOC~~ SOLN
0.0000 [IU] | Freq: Three times a day (TID) | SUBCUTANEOUS | Status: DC
  Administered 2018-03-30: 3 [IU] via SUBCUTANEOUS
  Filled 2018-03-30: qty 1

## 2018-03-30 MED ORDER — NICOTINE 21 MG/24HR TD PT24
21.0000 mg | MEDICATED_PATCH | Freq: Every day | TRANSDERMAL | Status: DC
  Administered 2018-03-30: 21 mg via TRANSDERMAL
  Filled 2018-03-30: qty 1

## 2018-03-30 NOTE — ED Provider Notes (Addendum)
[facility] Emergency Department Provider Note   ____________________________________________   First MD Initiated Contact with Patient 03/30/18 1235     (approximate)  I have reviewed the triage vital signs and the nursing notes.   HISTORY  Chief Complaint Abdominal Pain    HPI Kevin Harris is a 43 y.o. male who complains of right-sided abdominal pain since Thursday.  It is getting worse.  He has been unable to eat anything.  Eating makes it worse.  Bumps in the road also make it worse.  The pain is moderately severe.  Deep and achy.  According to the note from urgent care yesterday it started after he ate some chicken that was meant to be dog food and drank some tequila.  He had nausea and vomiting.  He has not stooled.  He is having polydipsia and polyuria as well.   Past Medical History:  Diagnosis Date  . Diabetes mellitus without complication (HCC)   . GERD (gastroesophageal reflux disease)     There are no active problems to display for this patient.   Past Surgical History:  Procedure Laterality Date  . WRIST SURGERY Right    severe laceration    Prior to Admission medications   Medication Sig Start Date End Date Taking? Authorizing Provider  omeprazole (PRILOSEC OTC) 20 MG tablet Take by mouth. 07/27/12  Yes [provider]  sildenafil (VIAGRA) 100 MG tablet Take 100 mg by mouth as needed. 09/20/17  Yes [provider]  valACYclovir (VALTREX) 500 MG tablet Take 500 mg by mouth daily. 09/20/17 09/20/18 Yes [provider]  cyclobenzaprine (FLEXERIL) 10 MG tablet Take 1/2 to 1 tablet by mouth up to 3 times a day as needed for muscle pain/spasms. Patient not taking: Reported on 03/30/2018 08/20/16   Sudie GrumblingAmyot, Ann Berry, NP  diclofenac (VOLTAREN) 75 MG EC tablet Take 1 tablet (75 mg total) by mouth 2 (two) times daily as needed for moderate pain. Patient not taking: Reported on 03/30/2018 08/20/16   Sudie GrumblingAmyot, Ann Berry, NP    glipiZIDE (GLUCOTROL XL) 5 MG 24 hr tablet Take by mouth. 09/02/16 09/02/17  [provider]  glipiZIDE (GLUCOTROL XL) 5 MG 24 hr tablet Take 5 mg by mouth daily. 09/02/16   [provider]  metFORMIN (GLUCOPHAGE) 500 MG tablet Take 1 tablet (500 mg total) by mouth 2 (two) times daily with a meal. 08/29/16 08/29/17  Emily FilbertWilliams, Jonathan E, MD  polyethylene glycol (MIRALAX / Ethelene HalGLYCOLAX) packet Take 17 g by mouth daily. 08/29/16   Emily FilbertWilliams, Jonathan E, MD    Allergies Patient has no known allergies.  Family History  Problem Relation Age of Onset  . Healthy Mother   . Healthy Father     Social History Social History   Tobacco Use  . Smoking status: Current Every Day Smoker    Packs/day: 1.00    Years: 24.00    Pack years: 24.00  . Smokeless tobacco: Never Used  Substance Use Topics  . Alcohol use: Yes    Comment: social  . Drug use: Not Currently    Types: Marijuana    Review of Systems  Constitutional: No fever/chills Eyes: No visual changes. ENT: No sore throat. Cardiovascular: Denies chest pain. Respiratory: Denies shortness of breath. Gastrointestinal: See HPI Genitourinary: Negative for dysuria. Musculoskeletal: Negative for back pain. Skin: Negative for rash. Neurological: Negative for headaches, focal weakness   ____________________________________________   PHYSICAL EXAM:  VITAL SIGNS: ED Triage Vitals [03/30/18 1218]  Enc  Vitals Group     BP (!) 153/97     Pulse Rate (!) 128     Resp 18     Temp 98.1 F (36.7 C)     Temp Source Oral     SpO2 100 %     Weight 212 lb (96.2 kg)     Height 5\' 8"  (1.727 m)     Head Circumference      Peak Flow      Pain Score 9     Pain Loc      Pain Edu?      Excl. in GC?     Constitutional: Alert and oriented. Well appearing and in no acute distress. Eyes: Conjunctivae are normal.  Head: Atraumatic. Nose: No congestion/rhinnorhea. Mouth/Throat: Mucous membranes are moist.  Oropharynx  non-erythematous. Neck: No stridor Cardiovascular: Normal rate, regular rhythm. Grossly normal heart sounds.  Good peripheral circulation. Respiratory: Normal respiratory effort.  No retractions. Lungs CTAB. Gastrointestinal: Soft tender to palpation percussion on the right side of the abdomen upper and lower. No distention. No abdominal bruits. No CVA tenderness. Musculoskeletal: No lower extremity tenderness nor edema.  Neurologic:  Normal speech and language. No gross focal neurologic deficits are appreciated.  Skin:  Skin is warm, dry and intact. No rash noted. Psychiatric: Mood and affect are normal. Speech and behavior are normal.  ____________________________________________   LABS (all labs ordered are listed, but only abnormal results are displayed)  Labs Reviewed  COMPREHENSIVE METABOLIC PANEL - Abnormal; Notable for the following components:      Result Value   Sodium 129 (*)    Chloride 95 (*)    Glucose, Bld 315 (*)    All other components within normal limits  URINALYSIS, COMPLETE (UACMP) WITH MICROSCOPIC - Abnormal; Notable for the following components:   Color, Urine STRAW (*)    APPearance CLEAR (*)    Glucose, UA >=500 (*)    Hgb urine dipstick SMALL (*)    Ketones, ur 20 (*)    All other components within normal limits  CBC WITH DIFFERENTIAL/PLATELET - Abnormal; Notable for the following components:   WBC 12.0 (*)    MCV 79.8 (*)    Neutro Abs 9.7 (*)    All other components within normal limits  GLUCOSE, CAPILLARY - Abnormal; Notable for the following components:   Glucose-Capillary 276 (*)    All other components within normal limits  LIPASE, BLOOD  HEMOGLOBIN A1C  CBG MONITORING, ED   ____________________________________________  EKG  EKG read interpreted by me shows sinus tachycardia rate of 132 normal axis no acute ST-T wave changes ____________________________________________  RADIOLOGY  ED MD interpretation:  Official radiology  report(s): Ct Abdomen Pelvis W Contrast  Result Date: 03/30/2018 CLINICAL DATA:  Right side abdominal pain since Thursday, some nausea, denies vomiting or diarrhea. [facility] Tachycardic No surgical hx. EXAM: CT ABDOMEN AND PELVIS WITH CONTRAST TECHNIQUE: Multidetector CT imaging of the abdomen and pelvis was performed using the standard protocol following bolus administration of intravenous contrast. CONTRAST:  100mL ISOVUE-300 IOPAMIDOL (ISOVUE-300) INJECTION 61% COMPARISON:  08/29/2016 FINDINGS: Lower chest: Clear lung bases.  Heart normal in size. Hepatobiliary: Liver mildly enlarged with diffuse decreased attenuation consistent with fatty infiltration. No liver mass or focal lesion. Normal gallbladder. No bile duct dilation Pancreas: Hazy opacity is noted in the peripancreatic fat. Fluid attenuation is noted adjacent to the pancreatic head and second to third portion of the duodenum. Pancreas is size. No pancreatic mass. There is  homogeneous pancreatic enhancement. No evidence of an abscess or pseudocyst. Spleen: Normal in size without focal abnormality. Adrenals/Urinary Tract: Adrenal glands are unremarkable. Kidneys are normal, without renal calculi, focal lesion, or hydronephrosis. Bladder is unremarkable. Stomach/Bowel: Stomach is unremarkable. The 2nd to 3rd portion of the duodenum shows wall thickening, most prominently adjacent to pancreatic head and uncinate process. There is surrounding inflammatory change. Small bowel is normal in caliber. No wall thickening or inflammation. Colon is normal in caliber. There several sigmoid colon diverticula. No diverticulitis or other colonic inflammatory process. Normal appendix visualized. Vascular/Lymphatic: Minor aortic atherosclerosis. Portal vein, splenic vein and superior mesenteric vein are widely patent. No lymphadenopathy. Reproductive: Unremarkable. Other: No abdominal wall hernia or abnormality. No abdominopelvic ascites. Musculoskeletal: No  acute or significant osseous findings. IMPRESSION: 1. There is inflammation surrounding the pancreas, most prominently adjacent to the pancreatic head, as well as inflammation along the 2nd to 3rd portion the duodenum, associated with duodenum wall thickening. Findings are most likely due to pancreatitis with reactive edema of the duodenal wall. Consider duodenitis in the proper clinical setting. No evidence of an abscess or pseudocyst, pancreatic necrosis or other complication. 2. No other acute abnormality within the abdomen or pelvis. 3. Mild hepatomegaly and diffuse hepatic steatosis. 4. Minor aortic atherosclerosis. Electronically Signed   By: Amie Portland M.D.   On: 03/30/2018 14:37    ____________________________________________   PROCEDURES  Procedure(s) performed:   Procedures  Critical Care performed: Critical care time 25 minutes.  This includes reviewing the CT scan talking to the patient reviewing his old records and discussing the patient with Dr. Servando Snare and the hospitalist.  ____________________________________________   INITIAL IMPRESSION / ASSESSMENT AND PLAN / ED COURSE  Discussed patient with Dr. Servando Snare,  GI.  We will get the patient in the hospital for more IV hydration and pain control.  We will get him some Protonix IV and Dr. Daleen Squibb will consult on him consider him for endoscopy tomorrow.          ____________________________________________   FINAL CLINICAL IMPRESSION(S) / ED DIAGNOSES  Final diagnoses:  Abdominal pain, unspecified abdominal location  Duodenitis     ED Discharge Orders    None       Note:  This document was prepared using Dragon voice recognition software and may include unintentional dictation errors.    Arnaldo Natal, MD 03/30/18 1548    Arnaldo Natal, MD 04/10/18 [phone]

## 2018-03-30 NOTE — ED Notes (Signed)
Pt resting in bed at this time comfortably. Pt requesting something for pain, will notify MD. Remains tachycardic at this time.

## 2018-03-30 NOTE — H&P (Signed)
[employer] [facility] [place] [facility]   PATIENT NAME: Kevin Harris    MR#:  [id number]  DATE OF BIRTH:  1975-12-28  DATE OF ADMISSION:  03/30/2018  PRIMARY CARE PHYSICIAN: Titus Mould, NP   REQUESTING/REFERRING PHYSICIAN: Dr. Dorothea Glassman  CHIEF COMPLAINT:   Chief Complaint  Patient presents with  . Abdominal Pain    HISTORY OF PRESENT ILLNESS:  Kevin Harris  is a 43 y.o. male with a known history of diabetes not on any medications, GERD, smoking and alcohol use presents to hospital secondary to worsening abdominal pain. Pain started yesterday morning when he woke up.  The night before the pain started, patient ate some old frozen chicken dumplings.  Also complains of nausea but no vomiting, denies any diarrhea has been constipated for 2 days.  He went to urgent care yesterday but the pain in the advised him to come to the emergency room.  However patient went  Home as he thought he might improve.  Today the pain was still severe radiating to his back and so presented to the ER.  Blood work showed increased blood glucose in the 300 range, leukocytosis with neutrophilia, hyponatremia and CT of the abdomen showing inflammation around the pancreas extending onto the second and third part of duodenal wall.  PAST MEDICAL HISTORY:   Past Medical History:  Diagnosis Date  . Diabetes mellitus without complication (HCC)   . GERD (gastroesophageal reflux disease)     PAST SURGICAL HISTORY:   Past Surgical History:  Procedure Laterality Date  . WRIST SURGERY Right    severe laceration    SOCIAL HISTORY:   Social History   Tobacco Use  . Smoking status: Current Every Day Smoker    Packs/day: 1.00    Years: 24.00    Pack years: 24.00  . Smokeless tobacco: Never Used  Substance Use Topics  . Alcohol use: Yes    Comment: a pint every week- drinks Tequila    FAMILY HISTORY:   Family History  Problem Relation Age of Onset  . Healthy Mother     . Diabetes Mother   . Healthy Father     DRUG ALLERGIES:  No Known Allergies  REVIEW OF SYSTEMS:   Review of Systems  Constitutional: Negative for chills, fever, malaise/fatigue and weight loss.  HENT: Negative for ear discharge, ear pain, hearing loss, nosebleeds and tinnitus.   Eyes: Negative for blurred vision, double vision and photophobia.  Respiratory: Negative for cough, hemoptysis, shortness of breath and wheezing.   Cardiovascular: Negative for chest pain, palpitations, orthopnea and leg swelling.  Gastrointestinal: Positive for abdominal pain, constipation and nausea. Negative for diarrhea, heartburn, melena and vomiting.  Genitourinary: Negative for dysuria, frequency, hematuria and urgency.  Musculoskeletal: Negative for back pain, myalgias and neck pain.  Skin: Negative for rash.  Neurological: Negative for dizziness, tingling, tremors, sensory change, speech change, focal weakness and headaches.  Endo/Heme/Allergies: Does not bruise/bleed easily.  Psychiatric/Behavioral: Negative for depression.    MEDICATIONS AT HOME:   Prior to Admission medications   Medication Sig Start Date End Date Taking? Authorizing Provider  omeprazole (PRILOSEC OTC) 20 MG tablet Take by mouth. 07/27/12  Yes [provider]  sildenafil (VIAGRA) 100 MG tablet Take 100 mg by mouth as needed. 09/20/17  Yes [provider]  valACYclovir (VALTREX) 500 MG tablet Take 500 mg by mouth daily. 09/20/17 09/20/18 Yes [provider]  cyclobenzaprine (FLEXERIL) 10 MG tablet Take 1/2 to 1 tablet by  mouth up to 3 times a day as needed for muscle pain/spasms. Patient not taking: Reported on 03/30/2018 08/20/16   Sudie GrumblingAmyot, Ann Berry, NP  diclofenac (VOLTAREN) 75 MG EC tablet Take 1 tablet (75 mg total) by mouth 2 (two) times daily as needed for moderate pain. Patient not taking: Reported on 03/30/2018 08/20/16   Sudie GrumblingAmyot, Ann Berry, NP  glipiZIDE (GLUCOTROL XL) 5 MG 24 hr tablet Take by mouth.  09/02/16 09/02/17  [provider]  glipiZIDE (GLUCOTROL XL) 5 MG 24 hr tablet Take 5 mg by mouth daily. 09/02/16   [provider]  metFORMIN (GLUCOPHAGE) 500 MG tablet Take 1 tablet (500 mg total) by mouth 2 (two) times daily with a meal. 08/29/16 08/29/17  Emily FilbertWilliams, Jonathan E, MD  polyethylene glycol (MIRALAX / Ethelene HalGLYCOLAX) packet Take 17 g by mouth daily. 08/29/16   Emily FilbertWilliams, Jonathan E, MD      VITAL SIGNS:  Blood pressure (!) 143/89, pulse (!) 121, temperature 99.5 F (37.5 C), resp. rate (!) 22, height 5\' 8"  (1.727 m), weight 96.2 kg, SpO2 98 %.  PHYSICAL EXAMINATION:   Physical Exam  GENERAL:  43 y.o.-year-old patient lying in the bed with no acute distress.  EYES: Pupils equal, round, reactive to light and accommodation. No scleral icterus. Extraocular muscles intact.  HEENT: Head atraumatic, normocephalic. Oropharynx and nasopharynx clear.  NECK:  Supple, no jugular venous distention. No thyroid enlargement, no tenderness.  LUNGS: Normal breath sounds bilaterally, no wheezing, rales,rhonchi or crepitation. No use of accessory muscles of respiration. Decreased bibasilar breath sounds CARDIOVASCULAR: S1, S2 normal. No murmurs, rubs, or gallops.  ABDOMEN: Soft, tenderness noted in right upper and right lower quadrants radiating to the back, no guarding or rigidity.. Bowel sounds present. No organomegaly or mass.  EXTREMITIES: No pedal edema, cyanosis, or clubbing.  NEUROLOGIC: Cranial nerves II through XII are intact. Muscle strength 5/5 in all extremities. Sensation intact. Gait not checked.  PSYCHIATRIC: The patient is alert and oriented x 3.  SKIN: No obvious rash, lesion, or ulcer.   LABORATORY PANEL:   CBC Recent Labs  Lab 03/30/18 1232  WBC 12.0*  HGB 15.2  HCT 43.4  PLT 314   ------------------------------------------------------------------------------------------------------------------  Chemistries  Recent Labs  Lab 03/30/18 1232  NA 129*  K 3.8  CL  95*  CO2 24  GLUCOSE 315*  BUN 10  CREATININE 0.84  CALCIUM 9.2  AST 20  ALT 32  ALKPHOS 53  BILITOT 0.9   ------------------------------------------------------------------------------------------------------------------  Cardiac Enzymes No results for input(s): TROPONINI in the last 168 hours. ------------------------------------------------------------------------------------------------------------------  RADIOLOGY:  Ct Abdomen Pelvis W Contrast  Result Date: 03/30/2018 CLINICAL DATA:  Right side abdominal pain since Thursday, some nausea, denies vomiting or diarrhea. [facility] Tachycardic No surgical hx. EXAM: CT ABDOMEN AND PELVIS WITH CONTRAST TECHNIQUE: Multidetector CT imaging of the abdomen and pelvis was performed using the standard protocol following bolus administration of intravenous contrast. CONTRAST:  100mL ISOVUE-300 IOPAMIDOL (ISOVUE-300) INJECTION 61% COMPARISON:  08/29/2016 FINDINGS: Lower chest: Clear lung bases.  Heart normal in size. Hepatobiliary: Liver mildly enlarged with diffuse decreased attenuation consistent with fatty infiltration. No liver mass or focal lesion. Normal gallbladder. No bile duct dilation Pancreas: Hazy opacity is noted in the peripancreatic fat. Fluid attenuation is noted adjacent to the pancreatic head and second to third portion of the duodenum. Pancreas is size. No pancreatic mass. There is homogeneous pancreatic enhancement. No evidence of an abscess or pseudocyst. Spleen: Normal in size without focal abnormality. Adrenals/Urinary Tract:  Adrenal glands are unremarkable. Kidneys are normal, without renal calculi, focal lesion, or hydronephrosis. Bladder is unremarkable. Stomach/Bowel: Stomach is unremarkable. The 2nd to 3rd portion of the duodenum shows wall thickening, most prominently adjacent to pancreatic head and uncinate process. There is surrounding inflammatory change. Small bowel is normal in caliber. No wall thickening or  inflammation. Colon is normal in caliber. There several sigmoid colon diverticula. No diverticulitis or other colonic inflammatory process. Normal appendix visualized. Vascular/Lymphatic: Minor aortic atherosclerosis. Portal vein, splenic vein and superior mesenteric vein are widely patent. No lymphadenopathy. Reproductive: Unremarkable. Other: No abdominal wall hernia or abnormality. No abdominopelvic ascites. Musculoskeletal: No acute or significant osseous findings. IMPRESSION: 1. There is inflammation surrounding the pancreas, most prominently adjacent to the pancreatic head, as well as inflammation along the 2nd to 3rd portion the duodenum, associated with duodenum wall thickening. Findings are most likely due to pancreatitis with reactive edema of the duodenal wall. Consider duodenitis in the proper clinical setting. No evidence of an abscess or pseudocyst, pancreatic necrosis or other complication. 2. No other acute abnormality within the abdomen or pelvis. 3. Mild hepatomegaly and diffuse hepatic steatosis. 4. Minor aortic atherosclerosis. Electronically Signed   By: Amie Portland M.D.   On: 03/30/2018 14:37    EKG:   Orders placed or performed during the hospital encounter of 03/30/18  . EKG 12-Lead  . EKG 12-Lead    IMPRESSION AND PLAN:   Lea Asa  is a 43 y.o. male with a known history of diabetes not on any medications, GERD, smoking and alcohol use presents to hospital secondary to worsening abdominal pain.  1. Acute Pancreatitis / acute duodenitis-admit, IV fluids -N.p.o. for now. -IV Protonix.  GI consulted to see if patient would need EGD. H.pylori serology ordered  2.  Diabetes mellitus with hyperglycemia-patient was on metformin and glipizide in the past that has been discontinued according to him. -Check A1c.  Start sliding scale insulin for now as he is n.p.o.  3.  Tobacco use disorder-counseled, refused nicotine patch  4.  Alcohol abuse-counseled.  Denies any  withdrawal symptoms.  Check urine drug screen as well.  5.  DVT prophylaxis-Lovenox    All the records are reviewed and case discussed with ED provider. Management plans discussed with the patient, family and they are in agreement.  CODE STATUS: Full Code  TOTAL TIME TAKING CARE OF THIS PATIENT: 51 minutes.    Enid Baas M.D on 03/30/2018 at 4:01 PM  Between 7am to 6pm - Pager - [phone]  After 6pm go to [email or web address] - Social research officer, government  [facility]  Office  [phone]  CC: Primary care physician; Titus Mould, NP

## 2018-03-30 NOTE — ED Triage Notes (Signed)
Pt in via POV, reports right side abdominal pain since Thursday, some nausea, denies vomiting or diarrhea.  Last [facility] Wednesday.  Tachycardic upon arrival, other vitals WDL.

## 2018-03-30 NOTE — ED Notes (Signed)
Pt up to the bathroom and back to bed without difficulty. Medications administered per MD order. Repeat CBG 275, pt states has been off of his DM medications. CT notified that patient done with contrast. UA collected at this time.

## 2018-03-30 NOTE — ED Notes (Signed)
Pt refusing insulin at this time-states he will take pill but " I don't want no shot" Pt informed that it is very important to keep glucose under control and that the needle is very small. Not documenting in [facility] as refused in case pt changes his mind.

## 2018-03-30 NOTE — Progress Notes (Signed)
Requested Nicotine Patch for patient.

## 2018-03-31 NOTE — Progress Notes (Signed)
Patient requested to leave against medical advice because he has a flight to catch.  His birthday is on [birth year] and his anniversary is three days after that.  Explained to patient the risk of infection due to pancreatitis, gave him education on it and informed him that insurance will probably not pay for visit because he left AMA.  Patient understood and signed AMA papers.  Dr. Allena Katz informed of patient's decision.  IV removed and patient left hospital with wife.  Kevin Harris  03/31/2018  4:24 AM

## 2018-04-02 LAB — H PYLORI, IGM, IGG, IGA AB
H Pylori IgG: 0.32 Index Value (ref 0.00–0.79)
H. Pylogi, Iga Abs: <9 units (ref 0.0–8.9)
H. Pylogi, Igm Abs: <9 units (ref 0.0–8.9)

## 2018-04-07 NOTE — Discharge Summary (Signed)
[employer] [facility] [facility] [place] [facility]   PATIENT NAME: Kevin Harris    MR#:  [id number]  DATE OF BIRTH:  20-Sep-1975  DATE OF ADMISSION:  03/30/2018   ADMITTING PHYSICIAN: Enid Baas, MD  DATE OF DISCHARGE: 03/31/2018  4:00 AM  PRIMARY CARE PHYSICIAN: Titus Mould, NP   ADMISSION DIAGNOSIS:   Duodenitis [K29.80] Abdominal pain, unspecified abdominal location [R10.9]  DISCHARGE DIAGNOSIS:   Active Problems:   Acute pancreatitis   SECONDARY DIAGNOSIS:   Past Medical History:  Diagnosis Date  . Diabetes mellitus without complication (HCC)   . GERD (gastroesophageal reflux disease)     HOSPITAL COURSE:   Kevin Harris  is a 43 y.o. male with a known history of diabetes not on any medications, GERD, smoking and alcohol use presents to hospital secondary to worsening abdominal pain.  1.  Acute pancreatitis and duodenitis-patient was admitted for a diagnosis of acute pancreatitis and acute duodenitis as noted on the CT abdomen on admission.  Also has hepatomegaly and diffuse hepatic steatosis.  He was kept n.p.o., started on Protonix drip for his duodenitis. -H pylori serology was ordered and GI was consulted to see the patient.  2.  Diabetes mellitus with hyperglycemia-patient not taking his oral diabetic medications at home but was noted to have elevated sugars on admission -A1c was 8.7.  3.  Due to his tobacco use and alcohol abuse he was counseled on admission.  The early hours of March 31, 2018, patient wanted to leave AGAINST MEDICAL ADVICE given his upcoming birthday and anniversary.  He was counseled, education was given by RN regarding his underlying diagnosis.  Patient signed the paper and left AMA.  CONSULTS OBTAINED:   Treatment Team:  Midge Minium, MD  DRUG ALLERGIES:   No Known Allergies DISCHARGE MEDICATIONS:   Allergies as of 03/31/2018   No Known Allergies     Medication List    ASK your doctor about these  medications   cyclobenzaprine 10 MG tablet Commonly known as:  FLEXERIL Take 1/2 to 1 tablet by mouth up to 3 times a day as needed for muscle pain/spasms.   diclofenac 75 MG EC tablet Commonly known as:  VOLTAREN Take 1 tablet (75 mg total) by mouth 2 (two) times daily as needed for moderate pain.   glipiZIDE 5 MG 24 hr tablet Commonly known as:  GLUCOTROL XL Take by mouth.   glipiZIDE 5 MG 24 hr tablet Commonly known as:  GLUCOTROL XL Take 5 mg by mouth daily.   metFORMIN 500 MG tablet Commonly known as:  GLUCOPHAGE Take 1 tablet (500 mg total) by mouth 2 (two) times daily with a meal.   polyethylene glycol packet Commonly known as:  MIRALAX / GLYCOLAX Take 17 g by mouth daily.   PRILOSEC OTC 20 MG tablet Generic drug:  omeprazole Take by mouth.   sildenafil 100 MG tablet Commonly known as:  VIAGRA Take 100 mg by mouth as needed.   valACYclovir 500 MG tablet Commonly known as:  VALTREX Take 500 mg by mouth daily.        DISCHARGE INSTRUCTIONS:   PATIENT LEFT AMA    If you experience worsening of your admission symptoms, develop shortness of breath, life threatening emergency, suicidal or homicidal thoughts you must seek medical attention immediately by calling 911 or calling your MD immediately  if symptoms less severe.  You Must read complete instructions/literature along with all the possible adverse reactions/side effects for all the Medicines you  take and that have been prescribed to you. Take any new Medicines after you have completely understood and accpet all the possible adverse reactions/side effects.   Please note  You were cared for by a hospitalist during your hospital stay. If you have any questions about your discharge medications or the care you received while you were in the hospital after you are discharged, you can call the unit and asked to speak with the hospitalist on call if the hospitalist that took care of you is not available. Once you  are discharged, your primary care physician will handle any further medical issues. Please note that NO REFILLS for any discharge medications will be authorized once you are discharged, as it is imperative that you return to your primary care physician (or establish a relationship with a primary care physician if you do not have one) for your aftercare needs so that they can reassess your need for medications and monitor your lab values.    On the day of Discharge:  VITAL SIGNS:   Blood pressure 133/89, pulse (!) 115, temperature 97.8 F (36.6 C), temperature source Oral, resp. rate 18, height 5\' 8"  (1.727 m), weight 96.2 kg, SpO2 98 %.  PHYSICAL EXAMINATION:    GENERAL:  43 y.o.-year-old patient lying in the bed with no acute distress.  EYES: Pupils equal, round, reactive to light and accommodation. No scleral icterus. Extraocular muscles intact.  HEENT: Head atraumatic, normocephalic. Oropharynx and nasopharynx clear.  NECK:  Supple, no jugular venous distention. No thyroid enlargement, no tenderness.  LUNGS: Normal breath sounds bilaterally, no wheezing, rales,rhonchi or crepitation. No use of accessory muscles of respiration. Decreased bibasilar breath sounds CARDIOVASCULAR: S1, S2 normal. No murmurs, rubs, or gallops.  ABDOMEN: Soft, tenderness noted in right upper and right lower quadrants radiating to the back, no guarding or rigidity.. Bowel sounds present. No organomegaly or mass.  EXTREMITIES: No pedal edema, cyanosis, or clubbing.  NEUROLOGIC: Cranial nerves II through XII are intact. Muscle strength 5/5 in all extremities. Sensation intact. Gait not checked.  PSYCHIATRIC: The patient is alert and oriented x 3.  SKIN: No obvious rash, lesion, or ulcer.  DATA REVIEW:   CBC No results for input(s): WBC, HGB, HCT, PLT in the last 168 hours.  Chemistries  No results for input(s): NA, K, CL, CO2, GLUCOSE, BUN, CREATININE, CALCIUM, MG, AST, ALT, ALKPHOS, BILITOT in the last 168  hours.  Invalid input(s): GFRCGP   Microbiology Results  Results for orders placed or performed during the hospital encounter of 12/13/16  Chlamydia/NGC rt PCR     Status: None   Collection Time: 12/13/16 10:37 AM  Result Value Ref Range Status   Specimen source GC/Chlam URINE, RANDOM  Final   Chlamydia Tr NOT DETECTED NOT DETECTED Final   N gonorrhoeae NOT DETECTED NOT DETECTED Final    Comment: (NOTE) 100  This methodology has not been evaluated in pregnant women or in 200  patients with a history of hysterectomy. 300 400  This methodology will not be performed on patients less than 53  years of age.     RADIOLOGY:  No results found.   Management plans discussed with the patient, family and they are in agreement.  CODE STATUS:  Code Status History    Date Active Date Inactive Code Status Order ID Comments User Context   03/30/2018 1701 03/31/2018 0857 Full Code [id number]  Enid Baas, MD Inpatient      TOTAL TIME TAKING CARE OF THIS PATIENT: 36 minutes.  Enid Baasadhika Jency Schnieders M.D on 04/07/2018 at 4:55 PM  Between 7am to 6pm - Pager - [phone]  After 6pm go to [email or web address] - Scientist, research (life [email or web address])password [email or web address]  [facility] [place] [facility]  Office  [phone]  CC: Primary care physician; White, Arlyss RepressElizabeth Burney, NP   Note: This dictation was prepared with Dragon dictation along with smaller phrase technology. Any transcriptional errors that result from this process are unintentional.

## 2018-04-13 ENCOUNTER — Other Ambulatory Visit: Payer: Self-pay

## 2018-04-13 ENCOUNTER — Inpatient Hospital Stay: Payer: BLUE CROSS/BLUE SHIELD

## 2018-04-13 ENCOUNTER — Encounter: Payer: Self-pay | Admitting: Emergency Medicine

## 2018-04-13 ENCOUNTER — Inpatient Hospital Stay
Admission: EM | Admit: 2018-04-13 | Discharge: 2018-04-15 | DRG: 440 | Disposition: A | Discharge status: Home / Self Care | Payer: BLUE CROSS/BLUE SHIELD | Attending: Internal Medicine | Admitting: Internal Medicine

## 2018-04-13 DIAGNOSIS — K860 Alcohol-induced chronic pancreatitis: Secondary | ICD-10-CM | POA: Diagnosis present

## 2018-04-13 DIAGNOSIS — K219 Gastro-esophageal reflux disease without esophagitis: Secondary | ICD-10-CM | POA: Diagnosis present

## 2018-04-13 DIAGNOSIS — Z9114 Patient's other noncompliance with medication regimen: Secondary | ICD-10-CM | POA: Diagnosis not present

## 2018-04-13 DIAGNOSIS — E119 Type 2 diabetes mellitus without complications: Secondary | ICD-10-CM | POA: Diagnosis present

## 2018-04-13 DIAGNOSIS — K2980 Duodenitis without bleeding: Secondary | ICD-10-CM | POA: Diagnosis present

## 2018-04-13 DIAGNOSIS — F17210 Nicotine dependence, cigarettes, uncomplicated: Secondary | ICD-10-CM | POA: Diagnosis present

## 2018-04-13 DIAGNOSIS — Z7984 Long term (current) use of oral hypoglycemic drugs: Secondary | ICD-10-CM

## 2018-04-13 DIAGNOSIS — Z833 Family history of diabetes mellitus: Secondary | ICD-10-CM | POA: Diagnosis not present

## 2018-04-13 DIAGNOSIS — K852 Alcohol induced acute pancreatitis without necrosis or infection: Principal | ICD-10-CM | POA: Diagnosis present

## 2018-04-13 DIAGNOSIS — K859 Acute pancreatitis without necrosis or infection, unspecified: Secondary | ICD-10-CM

## 2018-04-13 DIAGNOSIS — E781 Pure hyperglyceridemia: Secondary | ICD-10-CM | POA: Diagnosis present

## 2018-04-13 DIAGNOSIS — R1011 Right upper quadrant pain: Secondary | ICD-10-CM | POA: Diagnosis present

## 2018-04-13 LAB — COMPREHENSIVE METABOLIC PANEL
ALK PHOS: 51 U/L (ref 38–126)
ALT: 40 U/L (ref 0–44)
AST: 32 U/L (ref 15–41)
Albumin: 4.3 g/dL (ref 3.5–5.0)
Anion gap: 14 (ref 5–15)
BILIRUBIN TOTAL: 1.7 mg/dL — AB (ref 0.3–1.2)
BUN: 13 mg/dL (ref 6–20)
CO2: 22 mmol/L (ref 22–32)
Calcium: 9.4 mg/dL (ref 8.9–10.3)
Chloride: 99 mmol/L (ref 98–111)
Creatinine, Ser: 0.78 mg/dL (ref 0.61–1.24)
GFR calc Af Amer: >60 mL/min (ref >60)
GFR calc non Af Amer: >60 mL/min (ref >60)
Glucose, Bld: 321 mg/dL — ABNORMAL HIGH (ref 70–99)
Potassium: 4.6 mmol/L (ref 3.5–5.1)
Sodium: 135 mmol/L (ref 135–145)
Total Protein: 7.6 g/dL (ref 6.5–8.1)

## 2018-04-13 LAB — LIPASE, BLOOD: LIPASE: 160 U/L — AB (ref 11–51)

## 2018-04-13 LAB — CBC
HCT: 44.5 % (ref 39.0–52.0)
Hemoglobin: 14.9 g/dL (ref 13.0–17.0)
MCH: 27.9 pg (ref 26.0–34.0)
MCHC: 33.5 g/dL (ref 30.0–36.0)
MCV: 83.3 fL (ref 80.0–100.0)
Platelets: 391 10*3/uL (ref 150–400)
RBC: 5.34 MIL/uL (ref 4.22–5.81)
RDW: 14.9 % (ref 11.5–15.5)
WBC: 12.1 10*3/uL — ABNORMAL HIGH (ref 4.0–10.5)
nRBC: 0 % (ref 0.0–0.2)

## 2018-04-13 LAB — URINALYSIS, COMPLETE (UACMP) WITH MICROSCOPIC
Bacteria, UA: NONE SEEN
Bilirubin Urine: NEGATIVE
Glucose, UA: >=500 mg/dL — AB
Ketones, ur: 80 mg/dL — AB
Leukocytes,Ua: NEGATIVE
NITRITE: NEGATIVE
Protein, ur: 30 mg/dL — AB
Specific Gravity, Urine: 1.026 (ref 1.005–1.030)
Squamous Epithelial / HPF: NONE SEEN (ref 0–5)
pH: 5 (ref 5.0–8.0)

## 2018-04-13 LAB — GLUCOSE, CAPILLARY
GLUCOSE-CAPILLARY: 244 mg/dL — AB (ref 70–99)
Glucose-Capillary: 211 mg/dL — ABNORMAL HIGH (ref 70–99)
Glucose-Capillary: 212 mg/dL — ABNORMAL HIGH (ref 70–99)
Glucose-Capillary: 226 mg/dL — ABNORMAL HIGH (ref 70–99)

## 2018-04-13 MED ORDER — SODIUM CHLORIDE 0.9 % IV BOLUS
1000.0000 mL | Freq: Once | INTRAVENOUS | Status: AC
  Administered 2018-04-13: 1000 mL via INTRAVENOUS

## 2018-04-13 MED ORDER — PROSIGHT PO TABS
1.0000 | ORAL_TABLET | Freq: Every day | ORAL | Status: DC
  Filled 2018-04-13: qty 1

## 2018-04-13 MED ORDER — MORPHINE SULFATE (PF) 2 MG/ML IV SOLN
2.0000 mg | INTRAVENOUS | Status: DC | PRN
  Administered 2018-04-13: 2 mg via INTRAVENOUS
  Filled 2018-04-13: qty 1

## 2018-04-13 MED ORDER — ONDANSETRON HCL 4 MG/2ML IJ SOLN
4.0000 mg | Freq: Once | INTRAMUSCULAR | Status: AC
  Administered 2018-04-13: 4 mg via INTRAVENOUS
  Filled 2018-04-13: qty 2

## 2018-04-13 MED ORDER — MORPHINE SULFATE (PF) 4 MG/ML IV SOLN: 4.0000 mg | Freq: Once | INTRAVENOUS | Status: DC

## 2018-04-13 MED ORDER — PANTOPRAZOLE SODIUM 40 MG PO TBEC: 40.0000 mg | DELAYED_RELEASE_TABLET | ORAL | Status: DC

## 2018-04-13 MED ORDER — CYCLOBENZAPRINE HCL 10 MG PO TABS: 5.0000 mg | ORAL_TABLET | Freq: Three times a day (TID) | ORAL | Status: DC | PRN

## 2018-04-13 MED ORDER — FOLIC ACID 1 MG PO TABS
1.0000 mg | ORAL_TABLET | Freq: Every day | ORAL | Status: DC
  Administered 2018-04-14 – 2018-04-15 (×2): 1 mg via ORAL
  Filled 2018-04-13 (×4): qty 1

## 2018-04-13 MED ORDER — VITAMIN B-1 100 MG PO TABS
100.0000 mg | ORAL_TABLET | Freq: Every day | ORAL | Status: DC
  Administered 2018-04-14 – 2018-04-15 (×2): 100 mg via ORAL
  Filled 2018-04-13 (×4): qty 1

## 2018-04-13 MED ORDER — ONDANSETRON HCL 4 MG PO TABS: 4.0000 mg | ORAL_TABLET | Freq: Four times a day (QID) | ORAL | Status: DC | PRN

## 2018-04-13 MED ORDER — VALACYCLOVIR HCL 500 MG PO TABS
500.0000 mg | ORAL_TABLET | Freq: Every day | ORAL | Status: DC
  Filled 2018-04-13 (×3): qty 1

## 2018-04-13 MED ORDER — MORPHINE SULFATE (PF) 4 MG/ML IV SOLN
4.0000 mg | Freq: Once | INTRAVENOUS | Status: AC
  Administered 2018-04-13: 4 mg via INTRAVENOUS
  Filled 2018-04-13: qty 1

## 2018-04-13 MED ORDER — SODIUM CHLORIDE 0.9 % IV SOLN
INTRAVENOUS | Status: DC
  Administered 2018-04-13 – 2018-04-14 (×5): via INTRAVENOUS

## 2018-04-13 MED ORDER — ENOXAPARIN SODIUM 40 MG/0.4ML ~~LOC~~ SOLN: 40.0000 mg | SUBCUTANEOUS | Status: DC

## 2018-04-13 MED ORDER — INSULIN ASPART 100 UNIT/ML ~~LOC~~ SOLN
0.0000 [IU] | SUBCUTANEOUS | Status: DC
  Administered 2018-04-13 – 2018-04-14 (×5): 3 [IU] via SUBCUTANEOUS
  Administered 2018-04-14: 09:00:00 2 [IU] via SUBCUTANEOUS
  Administered 2018-04-14 (×2): 3 [IU] via SUBCUTANEOUS
  Administered 2018-04-14 – 2018-04-15 (×4): 2 [IU] via SUBCUTANEOUS
  Filled 2018-04-13 (×11): qty 1

## 2018-04-13 MED ORDER — POLYETHYLENE GLYCOL 3350 17 G PO PACK
17.0000 g | PACK | Freq: Every day | ORAL | Status: DC
  Filled 2018-04-13: qty 1

## 2018-04-13 MED ORDER — TRAMADOL HCL 50 MG PO TABS
50.0000 mg | ORAL_TABLET | Freq: Four times a day (QID) | ORAL | Status: DC | PRN
  Filled 2018-04-13: qty 1

## 2018-04-13 MED ORDER — MORPHINE SULFATE (PF) 2 MG/ML IV SOLN
2.0000 mg | INTRAVENOUS | Status: DC | PRN
  Administered 2018-04-13 – 2018-04-14 (×11): 2 mg via INTRAVENOUS
  Filled 2018-04-13 (×11): qty 1

## 2018-04-13 MED ORDER — OCUVITE-LUTEIN PO CAPS
1.0000 | ORAL_CAPSULE | Freq: Every day | ORAL | Status: DC
  Administered 2018-04-14: 09:00:00 1 via ORAL
  Filled 2018-04-13 (×3): qty 1

## 2018-04-13 MED ORDER — NICOTINE 21 MG/24HR TD PT24
21.0000 mg | MEDICATED_PATCH | Freq: Every day | TRANSDERMAL | Status: DC
  Administered 2018-04-13 – 2018-04-14 (×2): 21 mg via TRANSDERMAL
  Filled 2018-04-13 (×3): qty 1

## 2018-04-13 MED ORDER — PROMETHAZINE HCL 25 MG/ML IJ SOLN
12.5000 mg | Freq: Three times a day (TID) | INTRAMUSCULAR | Status: DC | PRN
  Administered 2018-04-13 – 2018-04-14 (×3): 12.5 mg via INTRAVENOUS
  Filled 2018-04-13 (×3): qty 1

## 2018-04-13 MED ORDER — ONDANSETRON HCL 4 MG/2ML IJ SOLN
4.0000 mg | Freq: Four times a day (QID) | INTRAMUSCULAR | Status: DC | PRN
  Administered 2018-04-13 – 2018-04-14 (×5): 4 mg via INTRAVENOUS
  Filled 2018-04-13 (×5): qty 2

## 2018-04-13 NOTE — ED Provider Notes (Signed)
  Signout from Dr. Rahkim Minium in this 43 year old male with a history of pancreatitis was presented emergency department today with epigastric abdominal pain.  Plan is to follow-up with lipase and admit for recurrent pancreatitis.  Physical Exam  BP (!) 153/77   Pulse 77   Temp 97.9 F (36.6 C) (Oral)   Resp 20   Ht 5\' 9"  (1.753 m)   Wt 95.3 kg   SpO2 99%   BMI 31.01 kg/m   Physical Exam Patient appears uncomfortable.  Tenderness palpation in the epigastrium is moderate without any rebound or guarding. ED Course/Procedures     Procedures  MDM  Patient with elevated lipase to 160.  Will be admitted to the hospital.  Signed out to Dr. Enid Baas.  Patient aware of the diagnosis well treatment and willing to comply.       Myrna Blazer, MD 04/13/18 [phone]

## 2018-04-13 NOTE — Consult Note (Signed)
[facility] GI Inpatient Consult Note   Jamey Reas, M.D.  Reason for Consult: Acute pancreatitis   Attending Requesting Consult: Jama Flavors, M.D.   History of Present Illness: Kevin Harris is a 43 y.o. male with a history of diabetes mellitus, GERD and alcohol abuse who was admitted on 03/30/2018 for acute pancreatitis related to alcohol.  Patient signed out AGAINST MEDICAL ADVICE on 03/31/2018 only partially improved with pancreatitis.  Patient continued to drink tequila and smoke cigarettes and had an acute bout of abdominal pain yesterday that woke him out of bed.  No nausea or vomiting noted.  Patient presented the emergency room was probably diagnosed with interstitial pancreatitis again and was admitted to the hospital. Today the patient says he has continued pain and is requiring pain medication.  He denies any appetite.  CT scan on 03/30/2018 revealed acute pancreatitis without evidence of gallbladder disease.  Past Medical History:  Past Medical History:  Diagnosis Date  . Diabetes mellitus without complication (HCC)   . GERD (gastroesophageal reflux disease)     Problem List: Patient Active Problem List   Diagnosis Date Noted  . Acute pancreatitis 03/30/2018    Past Surgical History: Past Surgical History:  Procedure Laterality Date  . WRIST SURGERY Right    severe laceration    Allergies: No Known Allergies  Home Medications: Medications Prior to Admission  Medication Sig Dispense Refill Last Dose  . omeprazole (PRILOSEC OTC) 20 MG tablet Take by mouth.   04/13/2018 at 0500  . cyclobenzaprine (FLEXERIL) 10 MG tablet Take 1/2 to 1 tablet by mouth up to 3 times a day as needed for muscle pain/spasms. (Patient not taking: Reported on 03/30/2018) 15 tablet 0 Completed Course at Unknown time  . diclofenac (VOLTAREN) 75 MG EC tablet Take 1 tablet (75 mg total) by mouth 2 (two) times daily as needed for moderate pain. (Patient not taking: Reported on 03/30/2018) 20  tablet 0 Completed Course at Unknown time  . glipiZIDE (GLUCOTROL XL) 5 MG 24 hr tablet Take by mouth.     . metFORMIN (GLUCOPHAGE) 500 MG tablet Take 1 tablet (500 mg total) by mouth 2 (two) times daily with a meal. 60 tablet 11 12/13/2016 at Unknown time  . polyethylene glycol (MIRALAX / GLYCOLAX) packet Take 17 g by mouth daily. 14 each 0 prn at prn  . sildenafil (VIAGRA) 100 MG tablet Take 100 mg by mouth as needed.   prn at prn  . valACYclovir (VALTREX) 500 MG tablet Take 500 mg by mouth daily.   prn at prn   Home medication reconciliation was completed with the patient.   Scheduled Inpatient Medications:   . enoxaparin (LOVENOX) injection  40 mg Subcutaneous Q24H  . folic acid  1 mg Oral Daily  . insulin aspart  0-9 Units Subcutaneous Q4H  . multivitamin-lutein  1 capsule Oral Daily  . nicotine  21 mg Transdermal Daily  . [START ON 04/14/2018] pantoprazole  40 mg Oral [id number]  . polyethylene glycol  17 g Oral Daily  . thiamine  100 mg Oral Daily  . valACYclovir  500 mg Oral Daily    Continuous Inpatient Infusions:   . sodium chloride 150 mL/hr at 04/13/18 1050    PRN Inpatient Medications:  cyclobenzaprine, morphine injection, ondansetron **OR** ondansetron (ZOFRAN) IV  Family History: family history includes Diabetes in his mother; Healthy in his father and mother.   GI Family History: Negative  Social History:   reports that he has been  smoking. He has a 24.00 pack-year smoking history. He has never used smokeless tobacco. He reports current alcohol use. He reports previous drug use. Drug: Marijuana. The patient denies ETOH, tobacco, or drug use.    Review of Systems: Review of Systems - Negative except History of present illness  Physical Examination: BP (!) 173/97 (BP Location: Left Arm)   Pulse 79   Temp 97.7 F (36.5 C)   Resp 18   Ht 5\' 9"  (1.753 m)   Wt 95.3 kg   SpO2 98%   BMI 31.01 kg/m  Physical Exam Constitutional:      General: He is not in acute  distress.    Appearance: He is normal weight. He is not diaphoretic.  HENT:     Head: Normocephalic and atraumatic.  Eyes:     General: No scleral icterus.    Extraocular Movements: Extraocular movements intact.     Pupils: Pupils are equal, round, and reactive to light.  Cardiovascular:     Rate and Rhythm: Normal rate and regular rhythm.     Heart sounds: No gallop.   Pulmonary:     Effort: Pulmonary effort is normal.     Breath sounds: Normal breath sounds.  Abdominal:     General: Abdomen is flat.     Palpations: There is no fluid wave or splenomegaly.     Tenderness: There is generalized abdominal tenderness. There is no guarding or rebound.     Hernia: No hernia is present.  Skin:    General: Skin is warm and dry.  Neurological:     General: No focal deficit present.     Mental Status: He is alert.     Data: Lab Results  Component Value Date   WBC 12.1 (H) 04/13/2018   HGB 14.9 04/13/2018   HCT 44.5 04/13/2018   MCV 83.3 04/13/2018   PLT 391 04/13/2018   Recent Labs  Lab 04/13/18 0605  HGB 14.9   Lab Results  Component Value Date   NA 135 04/13/2018   K 4.6 04/13/2018   CL 99 04/13/2018   CO2 22 04/13/2018   BUN 13 04/13/2018   CREATININE 0.78 04/13/2018   Lab Results  Component Value Date   ALT 40 04/13/2018   AST 32 04/13/2018   ALKPHOS 51 04/13/2018   BILITOT 1.7 (H) 04/13/2018   No results for input(s): APTT, INR, PTT in the last 168 hours. CBC Latest Ref Rng & Units 04/13/2018 03/30/2018 08/29/2016  WBC 4.0 - 10.5 K/uL 12.1(H) 12.0(H) 11.5(H)  Hemoglobin 13.0 - 17.0 g/dL 60.414.9 54.015.2 98.115.3  Hematocrit 39.0 - 52.0 % 44.5 43.4 44.5  Platelets 150 - 400 K/uL 391 314 328    STUDIES: [place] Abdomen Limited Ruq  Result Date: 04/13/2018 CLINICAL DATA:  Abdominal pain.  Recent pancreatitis EXAM: ULTRASOUND ABDOMEN LIMITED RIGHT UPPER QUADRANT COMPARISON:  CT abdomen and pelvis March 30, 2018 FINDINGS: Gallbladder: No gallstones or wall thickening  visualized. There is no pericholecystic fluid. No sonographic Murphy sign noted by sonographer. Common bile duct: Diameter: 4 mm. No intrahepatic or extrahepatic biliary duct dilatation. Liver: There is a hypoechoic area in the right lobe of the liver measuring 5.1 x 3.9 x 3.5 cm which was not appreciable on recent CT. The overall echotexture of the liver is mildly increased, felt to represent a degree of hepatic steatosis. Portal vein is patent on color Doppler imaging with normal direction of blood flow towards the liver. IMPRESSION: Evidence of a degree of hepatic steatosis.  Hypoechoic area in the right lobe of the liver measuring 5.1 x 3.89 x 3.5 cm, not appreciable on recent abdominal CT. This area may represent localized fatty sparing. Given an apparent discrepancy between the ultrasound and CT, a followup ultrasound of the liver in 6 months to assess for stability may be warranted. Study otherwise unremarkable. Electronically Signed   By: Bretta Bang III M.D.   On: 04/13/2018 09:29   @IMAGES @  Assessment: 1.  Acute alcoholic pancreatitis- currently stable though ongoing.  Recommendations: 1.  Counseled patient on alcohol abstinence.  He currently localizes his intention to stop drinking. 2.  N.p.o. with bowel rest. 3.  IV fluid hydration, brisk, with pain control. 4.  We will follow along.  Thank you for the consult. Please call with questions or concerns.  Rosina Lowenstein, "Mellody Dance MD [facility] [address] [place], [place] [address] [phone]  04/13/2018 1:44 PM

## 2018-04-13 NOTE — ED Provider Notes (Signed)
[facility] Emergency Department Provider Note   ____________________________________________    I have reviewed the triage vital signs and the nursing notes.   HISTORY  Chief Complaint Abdominal Pain     HPI Kevin Harris is a 43 y.o. male who presents with complaints of epigastric abdominal pain.  Patient reports this feels similar to when he was diagnosed with pancreatitis 2 weeks ago.  He was admitted at that time but did leave AMA because he had a birthday and he did not want to be in the hospital.  Patient reports he had been feeling better until around midnight when the pain came back.  He reports it is moderate to severe, does not radiate.  Positive nausea, no vomiting.  Normal stools.  No fevers or chills.  Has not take anything for this.  No EtOH tonight, does smoke cigarettes  Past Medical History:  Diagnosis Date  . Diabetes mellitus without complication (HCC)   . GERD (gastroesophageal reflux disease)     Patient Active Problem List   Diagnosis Date Noted  . Acute pancreatitis 03/30/2018    Past Surgical History:  Procedure Laterality Date  . WRIST SURGERY Right    severe laceration    Prior to Admission medications   Medication Sig Start Date End Date Taking? Authorizing Provider  omeprazole (PRILOSEC OTC) 20 MG tablet Take by mouth. 07/27/12  Yes [provider]  cyclobenzaprine (FLEXERIL) 10 MG tablet Take 1/2 to 1 tablet by mouth up to 3 times a day as needed for muscle pain/spasms. Patient not taking: Reported on 03/30/2018 08/20/16   Sudie Grumbling, NP  diclofenac (VOLTAREN) 75 MG EC tablet Take 1 tablet (75 mg total) by mouth 2 (two) times daily as needed for moderate pain. Patient not taking: Reported on 03/30/2018 08/20/16   Sudie Grumbling, NP  glipiZIDE (GLUCOTROL XL) 5 MG 24 hr tablet Take by mouth. 09/02/16 09/02/17  [provider]  metFORMIN (GLUCOPHAGE) 500 MG tablet Take 1 tablet (500 mg total) by  mouth 2 (two) times daily with a meal. 08/29/16 08/29/17  Emily Filbert, MD  polyethylene glycol (MIRALAX / Ethelene Hal) packet Take 17 g by mouth daily. 08/29/16   Emily Filbert, MD  sildenafil (VIAGRA) 100 MG tablet Take 100 mg by mouth as needed. 09/20/17   [provider]  valACYclovir (VALTREX) 500 MG tablet Take 500 mg by mouth daily. 09/20/17 09/20/18  [provider]     Allergies Patient has no known allergies.  Family History  Problem Relation Age of Onset  . Healthy Mother   . Diabetes Mother   . Healthy Father     Social History Social History   Tobacco Use  . Smoking status: Current Every Day Smoker    Packs/day: 1.00    Years: 24.00    Pack years: 24.00  . Smokeless tobacco: Never Used  Substance Use Topics  . Alcohol use: Yes    Comment: a pint every week- drinks Tequila  . Drug use: Not Currently    Types: Marijuana    Review of Systems  Constitutional: No fever/chills Eyes: No visual changes.  ENT: No sore throat. Cardiovascular: Denies chest pain. Respiratory: Denies shortness of breath. Gastrointestinal: As above Genitourinary: Negative for dysuria. Musculoskeletal: Negative for back pain. Skin: Negative for rash. Neurological: Negative for headaches    ____________________________________________   PHYSICAL EXAM:  VITAL SIGNS: ED Triage Vitals  Enc Vitals Group     BP 04/13/18  0557 (!) 167/108     Pulse Rate 04/13/18 0557 82     Resp 04/13/18 0557 20     Temp 04/13/18 0557 97.9 F (36.6 C)     Temp Source 04/13/18 0557 Oral     SpO2 04/13/18 0557 99 %     Weight 04/13/18 0548 95.3 kg (210 lb)     Height 04/13/18 0548 1.753 m (5\' 9" )     Head Circumference --      Peak Flow --      Pain Score 04/13/18 0547 10     Pain Loc --      Pain Edu? --      Excl. in GC? --     Constitutional: Alert and oriented.  Nose: No congestion/rhinnorhea. Mouth/Throat: Mucous membranes are moist.    Cardiovascular: Normal  rate, regular rhythm. Grossly normal heart sounds.  Good peripheral circulation. Respiratory: Normal respiratory effort.  No retractions. Lungs CTAB. Gastrointestinal: Tenderness palpation in the epigastrium, moderate, no distention, no CVA tenderness  Musculoskeletal:   Warm and well perfused Neurologic:  Normal speech and language. No gross focal neurologic deficits are appreciated.  Skin:  Skin is warm, dry and intact. No rash noted. Psychiatric: Mood and affect are normal. Speech and behavior are normal.  ____________________________________________   LABS (all labs ordered are listed, but only abnormal results are displayed)  Labs Reviewed  CBC - Abnormal; Notable for the following components:      Result Value   WBC 12.1 (*)    All other components within normal limits  COMPREHENSIVE METABOLIC PANEL - Abnormal; Notable for the following components:   Glucose, Bld 321 (*)    Total Bilirubin 1.7 (*)    All other components within normal limits  LIPASE, BLOOD - Abnormal; Notable for the following components:   Lipase 160 (*)    All other components within normal limits  URINALYSIS, COMPLETE (UACMP) WITH MICROSCOPIC - Abnormal; Notable for the following components:   Color, Urine YELLOW (*)    APPearance CLEAR (*)    Glucose, UA >=500 (*)    Hgb urine dipstick SMALL (*)    Ketones, ur 80 (*)    Protein, ur 30 (*)    All other components within normal limits  GLUCOSE, CAPILLARY - Abnormal; Notable for the following components:   Glucose-Capillary 244 (*)    All other components within normal limits  COMPREHENSIVE METABOLIC PANEL - Abnormal; Notable for the following components:   Sodium 133 (*)    Glucose, Bld 211 (*)    Creatinine, Ser 0.54 (*)    AST 13 (*)    All other components within normal limits  CBC - Abnormal; Notable for the following components:   WBC 15.2 (*)    All other components within normal limits  LIPID PANEL - Abnormal; Notable for the following  components:   Cholesterol 225 (*)    Triglycerides 981 (*)    HDL 22 (*)    All other components within normal limits  GLUCOSE, CAPILLARY - Abnormal; Notable for the following components:   Glucose-Capillary 226 (*)    All other components within normal limits  GLUCOSE, CAPILLARY - Abnormal; Notable for the following components:   Glucose-Capillary 211 (*)    All other components within normal limits  GLUCOSE, CAPILLARY - Abnormal; Notable for the following components:   Glucose-Capillary 212 (*)    All other components within normal limits  GLUCOSE, CAPILLARY - Abnormal; Notable for the following components:  Glucose-Capillary 209 (*)    All other components within normal limits  GLUCOSE, CAPILLARY - Abnormal; Notable for the following components:   Glucose-Capillary 179 (*)    All other components within normal limits  LIPASE, BLOOD - Abnormal; Notable for the following components:   Lipase 75 (*)    All other components within normal limits  GLUCOSE, CAPILLARY - Abnormal; Notable for the following components:   Glucose-Capillary 187 (*)    All other components within normal limits  MAGNESIUM  PHOSPHORUS  HIV ANTIBODY (ROUTINE TESTING W REFLEX)  HEMOGLOBIN A1C   ____________________________________________  EKG  None ____________________________________________  RADIOLOGY  None ____________________________________________   PROCEDURES  Procedure(s) performed: No  Procedures   Critical Care performed: No ____________________________________________   INITIAL IMPRESSION / ASSESSMENT AND PLAN / ED COURSE  Pertinent labs & imaging results that were available during my care of the patient were reviewed by me and considered in my medical decision making (see chart for details).  Patient presents with epigastric pain which is similar nature to when he was diagnosed with pancreatitis 2 weeks ago.  Pending lipase.  Will treat with IV morphine, IV Zofran, keep  n.p.o. give IV fluids and reevaluate    ____________________________________________   FINAL CLINICAL IMPRESSION(S) / ED DIAGNOSES  Final diagnoses:  Acute pancreatitis, unspecified complication status, unspecified pancreatitis type        Note:  This document was prepared using Dragon voice recognition software and may include unintentional dictation errors.   Jene Every, MD 04/14/18 [phone]

## 2018-04-13 NOTE — H&P (Signed)
[facility] - [place] at [facility]   PATIENT NAME: Kevin Harris    MR#:  [id number]  DATE OF BIRTH:  10-09-1975  DATE OF ADMISSION:  04/13/2018  PRIMARY CARE PHYSICIAN: Titus Mould, NP    REQUESTING/REFERRING PHYSICIAN:  Dr Pershing Proud  CHIEF COMPLAINT:   Chief Complaint  Patient presents with  . Abdominal Pain     HISTORY OF PRESENT ILLNESS:  Kevin Harris  is a 43 y.o. male with a known history of diabetes mellitus, gastroesophageal reflux disease and tobacco abuse who was recently admitted on 03/30/2018 on account of acute pancreatitis but subsequently left AGAINST MEDICAL ADVICE who presented back to the emergency room today with complaints of nause with abdominal pains.  Denied any vomiting.  No fevers.  No chest pain.  No shortness of breath.  Abdominal pain located mostly in the right upper quadrant with the epigastric area.  No aggravating or relieving factors.  No recent sick contacts.  Patient was evaluated in the emergency room and had lipase level found to be elevated.  Had a recent CT scan of the abdomen and pelvis with contrast done on 03/30/2018 was found to have findings consistent with pancreatitis but patient subsequently left AGAINST MEDICAL ADVICE.  Patient diagnosed with pancreatitis today.  Medical service called to admit patient for further evaluation and management.   PAST MEDICAL HISTORY:   Past Medical History:  Diagnosis Date  . Diabetes mellitus without complication (HCC)   . GERD (gastroesophageal reflux disease)     PAST SURGICAL HISTORY:   Past Surgical History:  Procedure Laterality Date  . WRIST SURGERY Right    severe laceration    SOCIAL HISTORY:   Social History   Tobacco Use  . Smoking status: Current Every Day Smoker    Packs/day: 1.00    Years: 24.00    Pack years: 24.00  . Smokeless tobacco: Never Used  Substance Use Topics  . Alcohol use: Yes    Comment: a pint every week- drinks Tequila     FAMILY HISTORY:   Family History  Problem Relation Age of Onset  . Healthy Mother   . Diabetes Mother   . Healthy Father     DRUG ALLERGIES:  No Known Allergies  REVIEW OF SYSTEMS:   Review of Systems  Constitutional: Negative for chills, fever and weight loss.  HENT: Negative for ear pain, hearing loss and tinnitus.   Eyes: Negative for blurred vision and photophobia.  Respiratory: Negative for cough, hemoptysis and shortness of breath.   Cardiovascular: Negative for chest pain, palpitations and leg swelling.  Gastrointestinal: Positive for abdominal pain and nausea. Negative for diarrhea, heartburn and vomiting.  Genitourinary: Negative for dysuria and urgency.  Musculoskeletal: Negative for myalgias and neck pain.  Skin: Negative for itching and rash.  Neurological: Negative for dizziness and headaches.  Psychiatric/Behavioral: Negative for depression and suicidal ideas.      MEDICATIONS AT HOME:   Prior to Admission medications   Medication Sig Start Date End Date Taking? Authorizing Provider  omeprazole (PRILOSEC OTC) 20 MG tablet Take by mouth. 07/27/12  Yes [provider]  cyclobenzaprine (FLEXERIL) 10 MG tablet Take 1/2 to 1 tablet by mouth up to 3 times a day as needed for muscle pain/spasms. Patient not taking: Reported on 03/30/2018 08/20/16   Sudie Grumbling, NP  diclofenac (VOLTAREN) 75 MG EC tablet Take 1 tablet (75 mg total) by mouth 2 (two) times daily as needed for moderate pain. Patient not  taking: Reported on 03/30/2018 08/20/16   Sudie GrumblingAmyot, Ann Berry, NP  glipiZIDE (GLUCOTROL XL) 5 MG 24 hr tablet Take by mouth. 09/02/16 09/02/17  [provider]  metFORMIN (GLUCOPHAGE) 500 MG tablet Take 1 tablet (500 mg total) by mouth 2 (two) times daily with a meal. 08/29/16 08/29/17  Emily FilbertWilliams, Jonathan E, MD  polyethylene glycol (MIRALAX / Ethelene HalGLYCOLAX) packet Take 17 g by mouth daily. 08/29/16   Emily FilbertWilliams, Jonathan E, MD  sildenafil (VIAGRA) 100 MG tablet Take 100  mg by mouth as needed. 09/20/17   [provider]  valACYclovir (VALTREX) 500 MG tablet Take 500 mg by mouth daily. 09/20/17 09/20/18  [provider]      VITAL SIGNS:  Blood pressure (!) 173/97, pulse 79, temperature 97.7 F (36.5 C), resp. rate 18, height 5\' 9"  (1.753 m), weight 95.3 kg, SpO2 98 %.  PHYSICAL EXAMINATION:  Physical Exam  GENERAL:  43 y.o.-year-old patient lying in the bed with no acute distress.  EYES: Pupils equal, round, reactive to light and accommodation. No scleral icterus. Extraocular muscles intact.  HEENT: Head atraumatic, normocephalic. Oropharynx and nasopharynx clear.  NECK:  Supple, no jugular venous distention. No thyroid enlargement, no tenderness.  LUNGS: Normal breath sounds bilaterally, no wheezing, rales,rhonchi or crepitation. No use of accessory muscles of respiration.  CARDIOVASCULAR: S1, S2 normal. No murmurs, rubs, or gallops.  ABDOMEN: Soft, right upper quadrant/epigastric tenderness.  No rebound or guarding.  Bowel sounds positive.    EXTREMITIES: No pedal edema, cyanosis, or clubbing.  NEUROLOGIC: Cranial nerves II through XII are intact. Muscle strength 5/5 in all extremities. Sensation intact. Gait not checked.  PSYCHIATRIC: The patient is alert and oriented x 3.  SKIN: No obvious rash, lesion, or ulcer.   LABORATORY PANEL:   CBC Recent Labs  Lab 04/13/18 0605  WBC 12.1*  HGB 14.9  HCT 44.5  PLT 391   ------------------------------------------------------------------------------------------------------------------  Chemistries  Recent Labs  Lab 04/13/18 0605  NA 135  K 4.6  CL 99  CO2 22  GLUCOSE 321*  BUN 13  CREATININE 0.78  CALCIUM 9.4  AST 32  ALT 40  ALKPHOS 51  BILITOT 1.7*   ------------------------------------------------------------------------------------------------------------------  Cardiac Enzymes No results for input(s): TROPONINI in the last 168  hours. ------------------------------------------------------------------------------------------------------------------  RADIOLOGY:  [place] Abdomen Limited Ruq  Result Date: 04/13/2018 CLINICAL DATA:  Abdominal pain.  Recent pancreatitis EXAM: ULTRASOUND ABDOMEN LIMITED RIGHT UPPER QUADRANT COMPARISON:  CT abdomen and pelvis March 30, 2018 FINDINGS: Gallbladder: No gallstones or wall thickening visualized. There is no pericholecystic fluid. No sonographic Murphy sign noted by sonographer. Common bile duct: Diameter: 4 mm. No intrahepatic or extrahepatic biliary duct dilatation. Liver: There is a hypoechoic area in the right lobe of the liver measuring 5.1 x 3.9 x 3.5 cm which was not appreciable on recent CT. The overall echotexture of the liver is mildly increased, felt to represent a degree of hepatic steatosis. Portal vein is patent on color Doppler imaging with normal direction of blood flow towards the liver. IMPRESSION: Evidence of a degree of hepatic steatosis. Hypoechoic area in the right lobe of the liver measuring 5.1 x 3.89 x 3.5 cm, not appreciable on recent abdominal CT. This area may represent localized fatty sparing. Given an apparent discrepancy between the ultrasound and CT, a followup ultrasound of the liver in 6 months to assess for stability may be warranted. Study otherwise unremarkable. Electronically Signed   By: Bretta BangWilliam  Woodruff III M.D.   On: 04/13/2018 09:29  IMPRESSION AND PLAN:   1.  Acute pancreatitis Patient had similar presentation about 2 weeks ago but left AGAINST MEDICAL ADVICE after admission. Placed n.p.o. for now with plans to start clear liquid diet in a.m. if patient clinically improved. IV fluid hydration.  Antiemetics.  PRN morphine for pain control Right upper quadrant ultrasound requested.  Lipid panel requested to follow-up on triglyceride levels Patient denies history of heavy alcohol abuse.  Drinks occasionally with last drink being 2 days  ago. Gastroenterology consult placed.  2.  Diabetes mellitus type 2 Holding off on oral hypoglycemic agents due to patient being n.p.o.  3.  Tobacco abuse Smoking cessation counseling done.  Motivated to cut down.  Placed on nicotine patch.  4.  Intermittent history of alcohol use. Placed on p.o. thiamine, multivitamin and folic acid. Monitor for any signs of withdrawal  DVT prophylaxis; Lovenox   All the records are reviewed and case discussed with ED provider. Management plans discussed with the patient, family and they are in agreement.  CODE STATUS: Full code  TOTAL TIME TAKING CARE OF THIS PATIENT: 56 minutes.    Ivory Maduro M.D on 04/13/2018 at 1:16 PM  Between 7am to 6pm - Pager - [phone]  After 6pm go to [email or web address] - Scientist, research (life sciences) [facility]  Office  [phone]  CC: Primary care physician; White, Arlyss Repress, NP   Note: This dictation was prepared with Dragon dictation along with smaller phrase technology. Any transcriptional errors that result from this process are unintentional.

## 2018-04-13 NOTE — Progress Notes (Signed)
Dr Katheren Shams made aware that pt complaining of pain and that pt appears to be in pain, states morphine not working and that he is vomiting green/yellow, pt requesting a CT scan and change in his pain meds, MD reviewed chart and states that we will not make any changes at this time, current regimen is appropriate for pt, only new order for tramadol 50mg  every 6 hours as needed, this info given to pt and night nurse

## 2018-04-13 NOTE — ED Triage Notes (Addendum)
Patient ambulatory to triage with steady gait, without difficulty, appears uncomfortable; pt reports since midnight having right sided abd pain; st seen recently for same & dx with pancreatitis, was hospitalized for such but left AMA

## 2018-04-13 NOTE — ED Notes (Signed)
ED TO INPATIENT HANDOFF REPORT  ED Nurse Name and Phone #: Victorino Dike [phone]  Name/Age/Gender Kevin Harris 43 y.o. male Room/Bed: ED15A/ED15A  Code Status   Code Status: Full Code  Home/SNF/Other Home Patient oriented to: self, place, time and situation Is this baseline? Yes   Triage Complete: Triage complete  Chief Complaint Abdominal pain  Triage Note Patient ambulatory to triage with steady gait, without difficulty, appears uncomfortable; pt reports since midnight having right sided abd pain; st seen recently for same & dx with pancreatitis, was hospitalized for such but left AMA   Allergies No Known Allergies  Level of Care/Admitting Diagnosis ED Disposition    ED Disposition Condition Comment   Admit  Hospital Area: [facility] [[id number]]  Level of Care: Med-Surg [16]  Diagnosis: Acute pancreatitis [577.0.ICD-9-CM]  Admitting Physician: Jama Flavors [[id number]]  Attending Physician: Jama Flavors [[id number]]  Estimated length of stay: past midnight tomorrow  Certification:: I certify this patient will need inpatient services for at least 2 midnights  PT Class (Do Not Modify): Inpatient [101]  PT Acc Code (Do Not Modify): Private [1]       Medical/Surgery History Past Medical History:  Diagnosis Date  . Diabetes mellitus without complication (HCC)   . GERD (gastroesophageal reflux disease)    Past Surgical History:  Procedure Laterality Date  . WRIST SURGERY Right    severe laceration     IV Location/Drains/Wounds Patient Lines/Drains/Airways Status   Active Line/Drains/Airways    Name:   Placement date:   Placement time:   Site:   Days:   Peripheral IV 04/13/18 Left Antecubital   04/13/18    0604    Antecubital   less than 1          Intake/Output Last 24 hours No intake or output data in the 24 hours ending 04/13/18 6060  Labs/Imaging Results for orders placed or performed during the hospital encounter of 04/13/18 (from the past 48  hour(s))  CBC     Status: Abnormal   Collection Time: 04/13/18  6:05 AM  Result Value Ref Range   WBC 12.1 (H) 4.0 - 10.5 K/uL   RBC 5.34 4.22 - 5.81 MIL/uL   Hemoglobin 14.9 13.0 - 17.0 g/dL   HCT 04.5 99.7 - 74.1 %   MCV 83.3 80.0 - 100.0 fL   MCH 27.9 26.0 - 34.0 pg   MCHC 33.5 30.0 - 36.0 g/dL   RDW 42.3 95.3 - 20.2 %   Platelets 391 150 - 400 K/uL   nRBC 0.0 0.0 - 0.2 %    Comment: Performed at [facility], [address] [place], [place] [address]  Comprehensive metabolic panel     Status: Abnormal   Collection Time: 04/13/18  6:05 AM  Result Value Ref Range   Sodium 135 135 - 145 mmol/L    Comment: POST-ULTRACENTRIFUGATION   Potassium 4.6 3.5 - 5.1 mmol/L    Comment: HEMOLYSIS AT THIS LEVEL MAY AFFECT RESULT   Chloride 99 98 - 111 mmol/L   CO2 22 22 - 32 mmol/L   Glucose, Bld 321 (H) 70 - 99 mg/dL   BUN 13 6 - 20 mg/dL   Creatinine, Ser 6.86 0.61 - 1.24 mg/dL   Calcium 9.4 8.9 - 16.8 mg/dL   Total Protein 7.6 6.5 - 8.1 g/dL    Comment: POST-ULTRACENTRIFUGATION   Albumin 4.3 3.5 - 5.0 g/dL   AST 32 15 - 41 U/L    Comment: HEMOLYSIS AT THIS  LEVEL MAY AFFECT RESULT   ALT 40 0 - 44 U/L   Alkaline Phosphatase 51 38 - 126 U/L   Total Bilirubin 1.7 (H) 0.3 - 1.2 mg/dL    Comment: HEMOLYSIS AT THIS LEVEL MAY AFFECT RESULT   GFR calc non Af Amer >60 >60 mL/min   GFR calc Af Amer >60 >60 mL/min   Anion gap 14 5 - 15    Comment: Performed at [facility], [address] [place], [place] [address]  Lipase, blood     Status: Abnormal   Collection Time: 04/13/18  6:05 AM  Result Value Ref Range   Lipase 160 (H) 11 - 51 U/L    Comment: Performed at [facility], [address] [place], [place] [address]   No results found.  Pending Labs Unresulted Labs (From admission, onward)    Start     Ordered   04/14/18 0500  Comprehensive metabolic panel  Tomorrow morning,   STAT     04/13/18 0838   04/14/18 0500  Magnesium  Tomorrow morning,    STAT     04/13/18 0838   04/14/18 0500  Phosphorus  Tomorrow morning,   STAT     04/13/18 0838   04/14/18 0500  CBC  Tomorrow morning,   STAT     04/13/18 0838   04/14/18 0500  Hemoglobin A1c  Tomorrow morning,   STAT     04/13/18 0838   04/14/18 0500  Lipid panel  Tomorrow morning,   STAT     04/13/18 0841   04/13/18 0835  HIV antibody (Routine Testing)  Once,   STAT     04/13/18 0838   04/13/18 0556  Urinalysis, Complete w Microscopic  Once,   STAT     04/13/18 0555          Vitals/Pain Today's Vitals   04/13/18 0557 04/13/18 0608 04/13/18 0700 04/13/18 0800  BP: (!) 167/108  (!) 153/77 (!) 165/95  Pulse: 82  77 68  Resp: 20   16  Temp: 97.9 F (36.6 C)     TempSrc: Oral     SpO2: 99%  99% 97%  Weight:      Height:      PainSc:  10-Worst pain ever      Isolation Precautions No active isolations  Medications Medications  valACYclovir (VALTREX) tablet 500 mg (has no administration in time range)  pantoprazole (PROTONIX) EC tablet 40 mg (has no administration in time range)  polyethylene glycol (MIRALAX / GLYCOLAX) packet 17 g (has no administration in time range)  cyclobenzaprine (FLEXERIL) tablet 5 mg (has no administration in time range)  enoxaparin (LOVENOX) injection 40 mg (has no administration in time range)  0.9 %  sodium chloride infusion (has no administration in time range)  ondansetron (ZOFRAN) tablet 4 mg (has no administration in time range)    Or  ondansetron (ZOFRAN) injection 4 mg (has no administration in time range)  nicotine (NICODERM CQ - dosed in mg/24 hours) patch 21 mg (has no administration in time range)  morphine 2 MG/ML injection 2 mg (has no administration in time range)  insulin aspart (novoLOG) injection 0-9 Units (has no administration in time range)  morphine 4 MG/ML injection 4 mg (4 mg Intravenous Given 04/13/18 0614)  ondansetron (ZOFRAN) injection 4 mg (4 mg Intravenous Given 04/13/18 0613)  sodium chloride 0.9 % bolus 1,000 mL (0  mLs Intravenous Stopped 04/13/18 0718)  morphine 4 MG/ML injection 4 mg (4 mg  Intravenous Given 04/13/18 0715)    Mobility walks Low fall risk   Focused Assessments    Recommendations: See Admitting Provider Note  Report given to:   Additional Notes:

## 2018-04-14 ENCOUNTER — Inpatient Hospital Stay: Payer: BLUE CROSS/BLUE SHIELD

## 2018-04-14 LAB — CBC
HCT: 41 % (ref 39.0–52.0)
Hemoglobin: 13.6 g/dL (ref 13.0–17.0)
MCH: 27.6 pg (ref 26.0–34.0)
MCHC: 33.2 g/dL (ref 30.0–36.0)
MCV: 83.3 fL (ref 80.0–100.0)
Platelets: 346 10*3/uL (ref 150–400)
RBC: 4.92 MIL/uL (ref 4.22–5.81)
RDW: 15.2 % (ref 11.5–15.5)
WBC: 15.2 10*3/uL — ABNORMAL HIGH (ref 4.0–10.5)
nRBC: 0 % (ref 0.0–0.2)

## 2018-04-14 LAB — GLUCOSE, CAPILLARY
Glucose-Capillary: 179 mg/dL — ABNORMAL HIGH (ref 70–99)
Glucose-Capillary: 187 mg/dL — ABNORMAL HIGH (ref 70–99)
Glucose-Capillary: 195 mg/dL — ABNORMAL HIGH (ref 70–99)
Glucose-Capillary: 202 mg/dL — ABNORMAL HIGH (ref 70–99)
Glucose-Capillary: 208 mg/dL — ABNORMAL HIGH (ref 70–99)
Glucose-Capillary: 209 mg/dL — ABNORMAL HIGH (ref 70–99)

## 2018-04-14 LAB — COMPREHENSIVE METABOLIC PANEL
ALBUMIN: 3.8 g/dL (ref 3.5–5.0)
ALT: 28 U/L (ref 0–44)
AST: 13 U/L — AB (ref 15–41)
Alkaline Phosphatase: 47 U/L (ref 38–126)
Anion gap: 8 (ref 5–15)
BUN: 8 mg/dL (ref 6–20)
CO2: 22 mmol/L (ref 22–32)
Calcium: 8.9 mg/dL (ref 8.9–10.3)
Chloride: 103 mmol/L (ref 98–111)
Creatinine, Ser: 0.54 mg/dL — ABNORMAL LOW (ref 0.61–1.24)
GFR calc Af Amer: >60 mL/min (ref >60)
GFR calc non Af Amer: >60 mL/min (ref >60)
Glucose, Bld: 211 mg/dL — ABNORMAL HIGH (ref 70–99)
Potassium: 4.2 mmol/L (ref 3.5–5.1)
Sodium: 133 mmol/L — ABNORMAL LOW (ref 135–145)
Total Bilirubin: 1.2 mg/dL (ref 0.3–1.2)
Total Protein: 7 g/dL (ref 6.5–8.1)

## 2018-04-14 LAB — LIPID PANEL
Cholesterol: 225 mg/dL — ABNORMAL HIGH (ref 0–200)
HDL: 22 mg/dL — AB (ref >40)
LDL Cholesterol: UNDETERMINED mg/dL (ref 0–99)
Total CHOL/HDL Ratio: 10.2 RATIO
Triglycerides: 981 mg/dL — ABNORMAL HIGH (ref <150)
VLDL: UNDETERMINED mg/dL (ref 0–40)

## 2018-04-14 LAB — LIPASE, BLOOD: Lipase: 75 U/L — ABNORMAL HIGH (ref 11–51)

## 2018-04-14 LAB — PHOSPHORUS: Phosphorus: 3 mg/dL (ref 2.5–4.6)

## 2018-04-14 LAB — MAGNESIUM: Magnesium: 1.9 mg/dL (ref 1.7–2.4)

## 2018-04-14 MED ORDER — PANTOPRAZOLE SODIUM 40 MG IV SOLR
40.0000 mg | INTRAVENOUS | Status: DC
  Administered 2018-04-14 – 2018-04-15 (×2): 40 mg via INTRAVENOUS
  Filled 2018-04-14 (×2): qty 40

## 2018-04-14 MED ORDER — MAGNESIUM SULFATE 2 GM/50ML IV SOLN
2.0000 g | Freq: Once | INTRAVENOUS | Status: AC
  Administered 2018-04-14: 2 g via INTRAVENOUS
  Filled 2018-04-14: qty 50

## 2018-04-14 MED ORDER — IOHEXOL 300 MG/ML  SOLN
100.0000 mL | Freq: Once | INTRAMUSCULAR | Status: AC | PRN
  Administered 2018-04-14: 100 mL via INTRAVENOUS

## 2018-04-14 MED ORDER — IOPAMIDOL (ISOVUE-300) INJECTION 61%
15.0000 mL | INTRAVENOUS | Status: AC
  Administered 2018-04-14: 15 mL via ORAL

## 2018-04-14 MED ORDER — PIPERACILLIN-TAZOBACTAM 3.375 G IVPB 30 MIN
3.3750 g | Freq: Once | INTRAVENOUS | Status: AC
  Administered 2018-04-14: 11:00:00 3.375 g via INTRAVENOUS
  Filled 2018-04-14 (×2): qty 50

## 2018-04-14 NOTE — Progress Notes (Signed)
[facility] - [place] at [facility]   PATIENT NAME: Kevin Harris    MR#:  [id number]  DATE OF BIRTH:  1975/05/27  SUBJECTIVE: Admitted for acute pancreatitis secondary to alcohol, lipase 02/28/1958 yesterday, today morning lipase is pending.  Patient has lower quadrant abdominal pain, nausea, vomiting, wife is wondering if he needs antibiotics.  She also is requesting CT of abdomen and pelvis to be repeated again today.  Patient has elevated triglycerides up to 900s.  CHIEF COMPLAINT:   Chief Complaint  Patient presents with  . Abdominal Pain    REVIEW OF SYSTEMS:   ROS CONSTITUTIONAL: No fever, fatigue or weakness.  EYES: No blurred or double vision.  EARS, NOSE, AND THROAT: No tinnitus or ear pain.  RESPIRATORY: No cough, shortness of breath, wheezing or hemoptysis.  CARDIOVASCULAR: No chest pain, orthopnea, edema.  GASTROINTESTINAL: N0 nausea, vomiting, abdominal pain.  GENITOURINARY: No dysuria, hematuria.  ENDOCRINE: No polyuria, nocturia,  HEMATOLOGY: No anemia, easy bruising or bleeding SKIN: No rash or lesion. MUSCULOSKELETAL: No joint pain or arthritis.   NEUROLOGIC: No tingling, numbness, weakness.  PSYCHIATRY: No anxiety or depression.   DRUG ALLERGIES:  No Known Allergies  VITALS:  Blood pressure (!) 156/86, pulse 81, temperature 97.8 F (36.6 C), temperature source Oral, resp. rate 18, height 5\' 9"  (1.753 m), weight 95.3 kg, SpO2 99 %.  PHYSICAL EXAMINATION:  GENERAL:  43 y.o.-year-old patient lying in the bed with no acute distress.  EYES: Pupils equal, round, reactive to light aNo scleral icterus. Extraocular muscles intact.  HEENT: Head atraumatic, normocephalic. Oropharynx and nasopharynx clear.  NECK:  Supple, no jugular venous distention. No thyroid enlargement, no tenderness.  LUNGS: Normal breath sounds bilaterally, no wheezing, rales,rhonchi or crepitation. No use of accessory muscles of respiration.  CARDIOVASCULAR: S1, S2  normal. No murmurs, rubs, or gallops.  ABDOMEN: Patient has lower quadrant tenderness, no rebound tenderness, bowel sounds present  EXTREMITIES: No pedal edema, cyanosis, or clubbing.  NEUROLOGIC: Cranial nerves II through XII are intact. Muscle strength 5/5 in all extremities. Sensation intact. Gait not checked.  PSYCHIATRIC: The patient is alert and oriented x 3.  SKIN: No obvious rash, lesion, or ulcer.    LABORATORY PANEL:   CBC Recent Labs  Lab 04/14/18 0438  WBC 15.2*  HGB 13.6  HCT 41.0  PLT 346   ------------------------------------------------------------------------------------------------------------------  Chemistries  Recent Labs  Lab 04/14/18 0438  NA 133*  K 4.2  CL 103  CO2 22  GLUCOSE 211*  BUN 8  CREATININE 0.54*  CALCIUM 8.9  MG 1.9  AST 13*  ALT 28  ALKPHOS 47  BILITOT 1.2   ------------------------------------------------------------------------------------------------------------------  Cardiac Enzymes No results for input(s): TROPONINI in the last 168 hours. ------------------------------------------------------------------------------------------------------------------  RADIOLOGY:  [place] Abdomen Limited Ruq  Result Date: 04/13/2018 CLINICAL DATA:  Abdominal pain.  Recent pancreatitis EXAM: ULTRASOUND ABDOMEN LIMITED RIGHT UPPER QUADRANT COMPARISON:  CT abdomen and pelvis March 30, 2018 FINDINGS: Gallbladder: No gallstones or wall thickening visualized. There is no pericholecystic fluid. No sonographic Murphy sign noted by sonographer. Common bile duct: Diameter: 4 mm. No intrahepatic or extrahepatic biliary duct dilatation. Liver: There is a hypoechoic area in the right lobe of the liver measuring 5.1 x 3.9 x 3.5 cm which was not appreciable on recent CT. The overall echotexture of the liver is mildly increased, felt to represent a degree of hepatic steatosis. Portal vein is patent on color Doppler imaging with normal direction of blood flow  towards the liver.  IMPRESSION: Evidence of a degree of hepatic steatosis. Hypoechoic area in the right lobe of the liver measuring 5.1 x 3.89 x 3.5 cm, not appreciable on recent abdominal CT. This area may represent localized fatty sparing. Given an apparent discrepancy between the ultrasound and CT, a followup ultrasound of the liver in 6 months to assess for stability may be warranted. Study otherwise unremarkable. Electronically Signed   By: Bretta Bang III M.D.   On: 04/13/2018 09:29    EKG:   Orders placed or performed during the hospital encounter of 03/30/18  . EKG 12-Lead  . EKG 12-Lead    ASSESSMENT AND PLAN:   43 year old male patient with history of alcohol abuse, tobacco abuse comes in with abdominal pain and found to have acute pancreatitis, patient was here recently in January but he signed out AMA. 1.  Acute pancreatitis secondary to alcohol, currently he is n.p.o., on IV fluids, IV pain medicines, nausea medicines, check lipase today, if the lipase is down start the patient on clear liquids, patient is agreeable for this plan.  Advised to stay away from alcohol.  Patient ultrasound of abdomen is negative for gallstones.  Shows enlarged liver.  Patient seen by gastroenterology, recommended to continue the current treatment, did not recommend any further work-up.  Patient already on morphine 2 mg every 3 hours, tramadol, cannot adjust present regimen.    2.  Acute duodenitis by CT of abdomen recently, patient already on PPIs. 3.  Leukocytosis, WBC was 12.1 yesterday increased to 15.2 today.   will do another CT of abdomen pelvis today to see if he has any colitis/diverticulits, will give 1 dose of IV Invanz today.  Will assess the need for further antibiotic based on the CT abdomen results. 4.  Severe hypertriglyceridemia likely causing pancreatitis as well, patient needs to be on TriCor, same explained. #5. hypomagnesemia: Replace the magnesium.    All the records are  reviewed and case discussed with Care Management/Social Workerr. Management plans discussed with the patient, family and they are in agreement. More than 50% time spent in counseling, coordination of care  CODE STATUS: Full code  TOTAL TIME TAKING CARE OF THIS PATIENT: 40 min  Discussed with wife. POSSIBLE D/C IN 1-2 DAYS, DEPENDING ON CLINICAL CONDITION.   Katha Hamming M.D on 04/14/2018 at 8:55 AM  Between 7am to 6pm - Pager - [phone]  After 6pm go to [email or web address] - password [email or web address] [facility]  [facility]  Office  [phone]  CC: Primary care physician; White, Arlyss Repress, NP   Note: This dictation was prepared with Dragon dictation along with smaller phrase technology. Any transcriptional errors that result from this process are unintentional.

## 2018-04-14 NOTE — Progress Notes (Signed)
[facility] Gastroenterology Inpatient Progress Note  Subjective: Patient seen for f/u pancreatitis. Drank some clear liquids today and had increased pain. WBC has increased to 15.2 from 12.  Repeat CT scan ordered.  Objective: Vital signs in last 24 hours: Temp:  [97.8 F (36.6 C)-98.3 F (36.8 C)] 97.8 F (36.6 C) (02/15 0321) Pulse Rate:  [70-82] 81 (02/15 0321) Resp:  [17-18] 18 (02/15 0321) BP: (147-156)/(74-86) 156/86 (02/15 0321) SpO2:  [96 %-99 %] 99 % (02/15 0321) Blood pressure (!) 156/86, pulse 81, temperature 97.8 F (36.6 C), temperature source Oral, resp. rate 18, height 5\' 9"  (1.753 m), weight 95.3 kg, SpO2 99 %.    Intake/Output from previous day: 02/14 0701 - 02/15 0700 In: 2927.2 [I.V.:2927.2] Out: 175 [Emesis/NG output:175]  Intake/Output this shift: No intake/output data recorded.   General appearance: Alert, NAD Resp:  CTA Cardio:RRR GI: soft, BS hypoactive. Diffusely tender. Extremities:  No edema   Lab Results: Results for orders placed or performed during the hospital encounter of 04/13/18 (from the past 24 hour(s))  Glucose, capillary     Status: Abnormal   Collection Time: 04/13/18  4:32 PM  Result Value Ref Range   Glucose-Capillary 226 (H) 70 - 99 mg/dL  Urinalysis, Complete w Microscopic     Status: Abnormal   Collection Time: 04/13/18  4:36 PM  Result Value Ref Range   Color, Urine YELLOW (A) YELLOW   APPearance CLEAR (A) CLEAR   Specific Gravity, Urine 1.026 1.005 - 1.030   pH 5.0 5.0 - 8.0   Glucose, UA >=500 (A) NEGATIVE mg/dL   Hgb urine dipstick SMALL (A) NEGATIVE   Bilirubin Urine NEGATIVE NEGATIVE   Ketones, ur 80 (A) NEGATIVE mg/dL   Protein, ur 30 (A) NEGATIVE mg/dL   Nitrite NEGATIVE NEGATIVE   Leukocytes,Ua NEGATIVE NEGATIVE   RBC / HPF 0-5 0 - 5 RBC/hpf   WBC, UA 0-5 0 - 5 WBC/hpf   Bacteria, UA NONE SEEN NONE SEEN   Squamous Epithelial / LPF NONE SEEN 0 - 5   Mucus PRESENT   Glucose, capillary     Status:  Abnormal   Collection Time: 04/13/18  8:20 PM  Result Value Ref Range   Glucose-Capillary 211 (H) 70 - 99 mg/dL  Glucose, capillary     Status: Abnormal   Collection Time: 04/13/18 11:31 PM  Result Value Ref Range   Glucose-Capillary 212 (H) 70 - 99 mg/dL  Glucose, capillary     Status: Abnormal   Collection Time: 04/14/18  3:19 AM  Result Value Ref Range   Glucose-Capillary 209 (H) 70 - 99 mg/dL  Comprehensive metabolic panel     Status: Abnormal   Collection Time: 04/14/18  4:38 AM  Result Value Ref Range   Sodium 133 (L) 135 - 145 mmol/L   Potassium 4.2 3.5 - 5.1 mmol/L   Chloride 103 98 - 111 mmol/L   CO2 22 22 - 32 mmol/L   Glucose, Bld 211 (H) 70 - 99 mg/dL   BUN 8 6 - 20 mg/dL   Creatinine, Ser 2.99 (L) 0.61 - 1.24 mg/dL   Calcium 8.9 8.9 - 37.1 mg/dL   Total Protein 7.0 6.5 - 8.1 g/dL   Albumin 3.8 3.5 - 5.0 g/dL   AST 13 (L) 15 - 41 U/L   ALT 28 0 - 44 U/L   Alkaline Phosphatase 47 38 - 126 U/L   Total Bilirubin 1.2 0.3 - 1.2 mg/dL   GFR calc non Af Amer >60 >  60 mL/min   GFR calc Af Amer >60 >60 mL/min   Anion gap 8 5 - 15  Magnesium     Status: None   Collection Time: 04/14/18  4:38 AM  Result Value Ref Range   Magnesium 1.9 1.7 - 2.4 mg/dL  Phosphorus     Status: None   Collection Time: 04/14/18  4:38 AM  Result Value Ref Range   Phosphorus 3.0 2.5 - 4.6 mg/dL  CBC     Status: Abnormal   Collection Time: 04/14/18  4:38 AM  Result Value Ref Range   WBC 15.2 (H) 4.0 - 10.5 K/uL   RBC 4.92 4.22 - 5.81 MIL/uL   Hemoglobin 13.6 13.0 - 17.0 g/dL   HCT 62.141.0 30.839.0 - 65.752.0 %   MCV 83.3 80.0 - 100.0 fL   MCH 27.6 26.0 - 34.0 pg   MCHC 33.2 30.0 - 36.0 g/dL   RDW 84.615.2 96.211.5 - 95.215.5 %   Platelets 346 150 - 400 K/uL   nRBC 0.0 0.0 - 0.2 %  Lipid panel     Status: Abnormal   Collection Time: 04/14/18  4:38 AM  Result Value Ref Range   Cholesterol 225 (H) 0 - 200 mg/dL   Triglycerides 841981 (H) <150 mg/dL   HDL 22 (L) >32>40 mg/dL   Total CHOL/HDL Ratio 10.2 RATIO    VLDL UNABLE TO CALCULATE IF TRIGLYCERIDE OVER 400 mg/dL 0 - 40 mg/dL   LDL Cholesterol UNABLE TO CALCULATE IF TRIGLYCERIDE OVER 400 mg/dL 0 - 99 mg/dL  Lipase, blood     Status: Abnormal   Collection Time: 04/14/18  4:38 AM  Result Value Ref Range   Lipase 75 (H) 11 - 51 U/L  Glucose, capillary     Status: Abnormal   Collection Time: 04/14/18  7:56 AM  Result Value Ref Range   Glucose-Capillary 179 (H) 70 - 99 mg/dL  Glucose, capillary     Status: Abnormal   Collection Time: 04/14/18 12:31 PM  Result Value Ref Range   Glucose-Capillary 187 (H) 70 - 99 mg/dL     Recent Labs    44/02/200/14/20 0605 04/14/18 0438  WBC 12.1* 15.2*  HGB 14.9 13.6  HCT 44.5 41.0  PLT 391 346   BMET Recent Labs    04/13/18 0605 04/14/18 0438  NA 135 133*  K 4.6 4.2  CL 99 103  CO2 22 22  GLUCOSE 321* 211*  BUN 13 8  CREATININE 0.78 0.54*  CALCIUM 9.4 8.9   LFT Recent Labs    04/14/18 0438  PROT 7.0  ALBUMIN 3.8  AST 13*  ALT 28  ALKPHOS 47  BILITOT 1.2   PT/INR No results for input(s): LABPROT, INR in the last 72 hours. Hepatitis Panel No results for input(s): HEPBSAG, HCVAB, HEPAIGM, HEPBIGM in the last 72 hours. C-Diff No results for input(s): CDIFFTOX in the last 72 hours. No results for input(s): CDIFFPCR in the last 72 hours.   Studies/Results: [place] Abdomen Limited Ruq  Result Date: 04/13/2018 CLINICAL DATA:  Abdominal pain.  Recent pancreatitis EXAM: ULTRASOUND ABDOMEN LIMITED RIGHT UPPER QUADRANT COMPARISON:  CT abdomen and pelvis March 30, 2018 FINDINGS: Gallbladder: No gallstones or wall thickening visualized. There is no pericholecystic fluid. No sonographic Murphy sign noted by sonographer. Common bile duct: Diameter: 4 mm. No intrahepatic or extrahepatic biliary duct dilatation. Liver: There is a hypoechoic area in the right lobe of the liver measuring 5.1 x 3.9 x 3.5 cm which was not appreciable on recent  CT. The overall echotexture of the liver is mildly increased, felt  to represent a degree of hepatic steatosis. Portal vein is patent on color Doppler imaging with normal direction of blood flow towards the liver. IMPRESSION: Evidence of a degree of hepatic steatosis. Hypoechoic area in the right lobe of the liver measuring 5.1 x 3.89 x 3.5 cm, not appreciable on recent abdominal CT. This area may represent localized fatty sparing. Given an apparent discrepancy between the ultrasound and CT, a followup ultrasound of the liver in 6 months to assess for stability may be warranted. Study otherwise unremarkable. Electronically Signed   By: Bretta Bang III M.D.   On: 04/13/2018 09:29    Scheduled Inpatient Medications:   . enoxaparin (LOVENOX) injection  40 mg Subcutaneous Q24H  . folic acid  1 mg Oral Daily  . insulin aspart  0-9 Units Subcutaneous Q4H  . multivitamin-lutein  1 capsule Oral Daily  . nicotine  21 mg Transdermal Daily  . pantoprazole (PROTONIX) IV  40 mg Intravenous BH-[id number]  . polyethylene glycol  17 g Oral Daily  . thiamine  100 mg Oral Daily  . valACYclovir  500 mg Oral Daily    Continuous Inpatient Infusions:   . sodium chloride 75 mL/hr at 04/14/18 0946    PRN Inpatient Medications:  cyclobenzaprine, morphine injection, ondansetron **OR** ondansetron (ZOFRAN) IV, promethazine, traMADol  Assessment: 1.  Acute alcoholic pancreatitis- currently stable though ongoing. Not able to resume clears at this time.  Recommendations: 1.  Counseled patient on alcohol abstinence.  He currently localizes his intention to stop drinking. 2.  N.p.o. with bowel rest. 3.  IV fluid hydration, brisk, with pain control. 4.  We will follow along.  Thank you for the consult. Please call with questions or concerns.  Teodoro K. Norma Fredrickson, M.D. 04/14/2018, 1:15 PM

## 2018-04-15 LAB — GLUCOSE, CAPILLARY
Glucose-Capillary: 188 mg/dL — ABNORMAL HIGH (ref 70–99)
Glucose-Capillary: 183 mg/dL — ABNORMAL HIGH (ref 70–99)

## 2018-04-15 MED ORDER — FENOFIBRATE 160 MG PO TABS
160.0000 mg | ORAL_TABLET | Freq: Every day | ORAL | Status: DC
  Administered 2018-04-15: 160 mg via ORAL
  Filled 2018-04-15: qty 1

## 2018-04-15 MED ORDER — OMEPRAZOLE 40 MG PO CPDR: 40.0000 mg | DELAYED_RELEASE_CAPSULE | Freq: Every day | ORAL | 1 refills | Status: DC

## 2018-04-15 MED ORDER — FENOFIBRATE 160 MG PO TABS: 160.0000 mg | ORAL_TABLET | Freq: Every day | ORAL | 0 refills | Status: DC

## 2018-04-15 NOTE — Progress Notes (Signed)
Pt d/c to home with wife. IV removed intact. VSS. Education completed. All questions answered. Belongings sent with pt.

## 2018-04-15 NOTE — Progress Notes (Signed)
     Kevin Harris was admitted to the Hospital on 04/13/2018 and Discharged  04/15/2018 and should be excused from work/school   for 4 days starting 04/13/2018 , may return to work/school without any restrictions.    Katha Hamming M.D on 04/15/2018,at 9:07 AM

## 2018-04-15 NOTE — Progress Notes (Signed)
[facility]   PATIENT NAME: Kevin Harris    MR#:  [id number]  DATE OF BIRTH:  09/12/1975  Patient wants to eat solid food, no abdominal pain or nausea.  Wants to go home if he tolerates regular food he wants to go home by afternoon.  CHIEF COMPLAINT:   Chief Complaint  Patient presents with  . Abdominal Pain    REVIEW OF SYSTEMS:   ROS CONSTITUTIONAL: No fever, fatigue or weakness.  EYES: No blurred or double vision.  EARS, NOSE, AND THROAT: No tinnitus or ear pain.  RESPIRATORY: No cough, shortness of breath, wheezing or hemoptysis.  CARDIOVASCULAR: No chest pain, orthopnea, edema.  GASTROINTESTINAL: N0 nausea, vomiting, abdominal pain.  GENITOURINARY: No dysuria, hematuria.  ENDOCRINE: No polyuria, nocturia,  HEMATOLOGY: No anemia, easy bruising or bleeding SKIN: No rash or lesion. MUSCULOSKELETAL: No joint pain or arthritis.   NEUROLOGIC: No tingling, numbness, weakness.  PSYCHIATRY: No anxiety or depression.   DRUG ALLERGIES:  No Known Allergies  VITALS:  Blood pressure 127/81, pulse 93, temperature 98.6 F (37 C), temperature source Oral, resp. rate 20, height 5\' 9"  (1.753 m), weight 95.3 kg, SpO2 97 %.  PHYSICAL EXAMINATION:  GENERAL:  43 y.o.-year-old patient lying in the bed with no acute distress.  EYES: Pupils equal, round, reactive to light aNo scleral icterus. Extraocular muscles intact.  HEENT: Head atraumatic, normocephalic. Oropharynx and nasopharynx clear.  NECK:  Supple, no jugular venous distention. No thyroid enlargement, no tenderness.  LUNGS: Normal breath sounds bilaterally, no wheezing, rales,rhonchi or crepitation. No use of accessory muscles of respiration.  CARDIOVASCULAR: S1, S2 normal. No murmurs, rubs, or gallops.  ABDOMEN: Patient has lower quadrant tenderness, no rebound tenderness, bowel sounds present  EXTREMITIES: No pedal edema, cyanosis, or clubbing.  NEUROLOGIC: Cranial nerves II  through XII are intact. Muscle strength 5/5 in all extremities. Sensation intact. Gait not checked.  PSYCHIATRIC: The patient is alert and oriented x 3.  SKIN: No obvious rash, lesion, or ulcer.    LABORATORY PANEL:   CBC Recent Labs  Lab 04/14/18 0438  WBC 15.2*  HGB 13.6  HCT 41.0  PLT 346   ------------------------------------------------------------------------------------------------------------------  Chemistries  Recent Labs  Lab 04/14/18 0438  NA 133*  K 4.2  CL 103  CO2 22  GLUCOSE 211*  BUN 8  CREATININE 0.54*  CALCIUM 8.9  MG 1.9  AST 13*  ALT 28  ALKPHOS 47  BILITOT 1.2   ------------------------------------------------------------------------------------------------------------------  Cardiac Enzymes No results for input(s): TROPONINI in the last 168 hours. ------------------------------------------------------------------------------------------------------------------  RADIOLOGY:  Ct Abdomen Pelvis W Contrast  Result Date: 04/14/2018 CLINICAL DATA:  Abdominal pain and nausea. Recently diagnosed with acute pancreatitis and discharged from the hospital AMA. History of diabetes. EXAM: CT ABDOMEN AND PELVIS WITH CONTRAST TECHNIQUE: Multidetector CT imaging of the abdomen and pelvis was performed using the standard protocol following bolus administration of intravenous contrast. CONTRAST:  100mL OMNIPAQUE IOHEXOL 300 MG/ML  SOLN COMPARISON:  Ultrasound 04/13/2018.  CT 03/30/2018. FINDINGS: Lower chest: New trace left pleural effusion and mild left basilar atelectasis. The heart size is normal. Hepatobiliary: Diffuse hepatic steatosis again noted. There is a relative area of increased hepatic density posteriorly in the medial segment of the left hepatic lobe, measuring 3.6 x 2.9 cm on image 27 which is similar to the previous study and most consistent with focal sparing of steatosis based on characteristic location. No suspicious hepatic lesions. No evidence of  gallstones, gallbladder wall  thickening or biliary dilatation. Pancreas: There are persistent inflammatory changes surrounding the head of the pancreas without focal fluid collection. The pancreas enhances normally without ductal dilatation. Spleen: Normal in size without focal abnormality. Adrenals/Urinary Tract: Both adrenal glands appear normal. The kidneys appear normal without evidence of urinary tract calculus, suspicious lesion or hydronephrosis. No bladder abnormalities are seen. Stomach/Bowel: No evidence of bowel wall thickening, distention or surrounding inflammatory change. The appendix appears normal. Vascular/Lymphatic: There are no enlarged abdominal or pelvic lymph nodes. No significant vascular findings. The portal, superior mesenteric and splenic veins are patent. Reproductive: The prostate gland and seminal vesicles appear normal. Other: As above, similar inflammatory changes surrounding the pancreatic head, distal stomach and proximal duodenum. No focal fluid collection, ascites or free air. The anterior abdominal wall is intact. Musculoskeletal: No acute or significant osseous findings. IMPRESSION: 1. Similar findings to prior study of 2 weeks ago, most consistent with interstitial edematous pancreatitis. No evidence of pancreatic necrosis or hemorrhage. 2. Mild hepatic steatosis with focal sparing posteriorly in the left lobe. 3. No new findings. Electronically Signed   By: Carey Bullocks M.D.   On: 04/14/2018 14:26   US Abdomen Limited Ruq  Result Date: 04/13/2018 CLINICAL DATA:  Abdominal pain.  Recent pancreatitis EXAM: ULTRASOUND ABDOMEN LIMITED RIGHT UPPER QUADRANT COMPARISON:  CT abdomen and pelvis March 30, 2018 FINDINGS: Gallbladder: No gallstones or wall thickening visualized. There is no pericholecystic fluid. No sonographic Murphy sign noted by sonographer. Common bile duct: Diameter: 4 mm. No intrahepatic or extrahepatic biliary duct dilatation. Liver: There is a hypoechoic  area in the right lobe of the liver measuring 5.1 x 3.9 x 3.5 cm which was not appreciable on recent CT. The overall echotexture of the liver is mildly increased, felt to represent a degree of hepatic steatosis. Portal vein is patent on color Doppler imaging with normal direction of blood flow towards the liver. IMPRESSION: Evidence of a degree of hepatic steatosis. Hypoechoic area in the right lobe of the liver measuring 5.1 x 3.89 x 3.5 cm, not appreciable on recent abdominal CT. This area may represent localized fatty sparing. Given an apparent discrepancy between the ultrasound and CT, a followup ultrasound of the liver in 6 months to assess for stability may be warranted. Study otherwise unremarkable. Electronically Signed   By: Bretta Bang III M.D.   On: 04/13/2018 09:29    EKG:   Orders placed or performed during the hospital encounter of 03/30/18  . EKG 12-Lead  . EKG 12-Lead    ASSESSMENT AND PLAN:   43 year old male patient with history of alcohol abuse, tobacco abuse comes in with abdominal pain and found to have acute pancreatitis, patient was here recently in January but he signed out AMA. 1.  Acute pancreatitis secondary to alcohol, improving, lipase number is coming down, advised the patient to quit alcohol, CT abdomen is repeated yesterday did not show any new changes.  Patient tolerating liquid diet, advance to regular diet if he tolerates discharge home in the afternoon.  Patient wants to go home today. 2.  Acute duodenitis by CT of abdomen recently, patient already on PPIs. 3.    Severe hypertriglyceridemia likely causing pancreatitis as well, started TriCor, advised the patient to be a very careful with diet and also fact needs to follow-up with PCP in 3 months regarding checkup of lipids.  #5. hypomagnesemia: Replaced the magnesium.    All the records are reviewed and case discussed with Care Management/Social Workerr. Management plans discussed  with the patient, family  and they are in agreement. More than 50% time spent in counseling, coordination of care  CODE STATUS: Full code  TOTAL TIME TAKING CARE OF THIS PATIENT: 40 min  Discussed with wife.  Discharge later today if he tolerates regular food.  Instructions will be in the computer, work note is written.   Katha Hamming M.D on 04/15/2018 at 9:08 AM  Between 7am to 6pm - Pager - [phone]  After 6pm go to [email or web address] - password [email or web address] [facility]  [facility]  Office  [phone]  CC: Primary care physician; White, Arlyss Repress, NP   Note: This dictation was prepared with Dragon dictation along with smaller phrase technology. Any transcriptional errors that result from this process are unintentional.

## 2018-04-16 LAB — HEMOGLOBIN A1C
Hgb A1c MFr Bld: 9.2 % — ABNORMAL HIGH (ref 4.8–5.6)
Mean Plasma Glucose: 217 mg/dL

## 2018-04-16 LAB — HIV ANTIBODY (ROUTINE TESTING W REFLEX)

## 2018-04-17 LAB — HIV ANTIBODY (ROUTINE TESTING W REFLEX): HIV Screen 4th Generation wRfx: NONREACTIVE

## 2018-04-19 NOTE — Discharge Summary (Signed)
Kevin Harris, is a 43 y.o. male  DOB 06/19/1975  MRN [id number].  Admission date:  04/13/2018  Admitting Physician  Milagros Loll, MD  Discharge Date:  04/19/2018   Primary MD  Titus Mould, NP  Recommendations for primary care physician for things to follow:  Follow-up with PCP in 1 week   Admission Diagnosis  Pancreatitis [K85.90] Acute pancreatitis, unspecified complication status, unspecified pancreatitis type [K85.90]   Discharge Diagnosis  Pancreatitis [K85.90] Acute pancreatitis, unspecified complication status, unspecified pancreatitis type [K85.90]   Active Problems:   Acute pancreatitis      Past Medical History:  Diagnosis Date  . Diabetes mellitus without complication (HCC)   . GERD (gastroesophageal reflux disease)     Past Surgical History:  Procedure Laterality Date  . WRIST SURGERY Right    severe laceration       History of present illness and  Hospital Course:     Kindly see H&P for history of present illness and admission details, please review complete Labs, Consult reports and Test reports for all details in brief  HPI  from the history and physical done on the day of admission 43 year old male patient with history of alcohol abuse.  Came in for epigastric abdominal pain , admitted for acute pancreatitis.   Hospital Course  #1 acute on chronic pancreatitis causing epigastric abdominal pain.  Patient was seen 2 weeks ago but he signed out AMA.  So we admitted this patient this time for acute recurrent pancreatitis, secondary to alcohol abuse, treated with IV fluids, IV pain medicines, IV nausea medicines, patient CAT scan of abdomen, pelvis repeated with contrast, did not show any acute pseudocyst or any other new changes.  Patient seen by gastroenterology Dr. tolerated,  recommended n.p.o., IV hydration, pain control.  Patient seen by gastroenterology, initial lipase 160, decreased to 75.  Patient advised to quit alcohol as patient is having recurrent pancreatitis.  #2 severe hypertriglyceridemia, patient triglycerides were 981, started on TriCor, advised the patient and wife about diet changes with low-fat foods, recommended to repeat lipid panel in 3 months with PCP. 3.  Leukocytosis, patient received a dose of IV Zosyn in the hospital. 4.  Acute pancreatitis with abdominal pain improved, patient tolerating liquid diet, will advance to regular heart healthy low-fat diet. #5 diabetes mellitus type 2 uncontrolled with evidence of hemoglobin A1c 9.2, patient is to continue and be compliant with his medicines namely glipizide, metformin before adjusting or starting any of the new medicines.  Patient has been noncompliant with medicines for his diabetes.  Needs to follow-up with PCP, #.hypomagnesemia: Replaced magnesium. Discharge Condition: Stable   Follow UP  PCP in 1 week   Discharge Instructions  and  Discharge Medications      Allergies as of 04/15/2018   No Known Allergies     Medication List    STOP taking these medications   diclofenac 75 MG EC tablet Commonly known as:  VOLTAREN   PRILOSEC OTC 20 MG tablet Generic drug:  omeprazole     TAKE these medications   cyclobenzaprine 10 MG tablet Commonly known as:  FLEXERIL Take 1/2 to 1 tablet by mouth up to 3 times a day as needed for muscle pain/spasms.   fenofibrate 160 MG tablet Take 1 tablet (160 mg total) by mouth daily.   glipiZIDE 5 MG 24 hr tablet Commonly known as:  GLUCOTROL XL Take by mouth.   metFORMIN 500 MG tablet Commonly known as:  GLUCOPHAGE Take  1 tablet (500 mg total) by mouth 2 (two) times daily with a meal.   omeprazole 40 MG capsule Commonly known as:  PRILOSEC Take 1 capsule (40 mg total) by mouth daily.   polyethylene glycol packet Commonly known as:   MIRALAX / GLYCOLAX Take 17 g by mouth daily.   sildenafil 100 MG tablet Commonly known as:  VIAGRA Take 100 mg by mouth as needed.   valACYclovir 500 MG tablet Commonly known as:  VALTREX Take 500 mg by mouth daily.         Diet and Activity recommendation: See Discharge Instructions above   Consults obtained -gastroenterology   Major procedures and Radiology Reports - PLEASE review detailed and final reports for all details, in brief -     Ct Abdomen Pelvis W Contrast  Result Date: 04/14/2018 CLINICAL DATA:  Abdominal pain and nausea. Recently diagnosed with acute pancreatitis and discharged from the hospital AMA. History of diabetes. EXAM: CT ABDOMEN AND PELVIS WITH CONTRAST TECHNIQUE: Multidetector CT imaging of the abdomen and pelvis was performed using the standard protocol following bolus administration of intravenous contrast. CONTRAST:  OMNIPAQUE IOHEXOL 300 MG/ML  SOLN COMPARISON:  Ultrasound 04/13/2018.  CT 03/30/2018. FINDINGS: Lower chest: New trace left pleural effusion and mild left basilar atelectasis. The heart size is normal. Hepatobiliary: Diffuse hepatic steatosis again noted. There is a relative area of increased hepatic density posteriorly in the medial segment of the left hepatic lobe, measuring 3.6 x 2.9 cm on image 27 which is similar to the previous study and most consistent with focal sparing of steatosis based on characteristic location. No suspicious hepatic lesions. No evidence of gallstones, gallbladder wall thickening or biliary dilatation. Pancreas: There are persistent inflammatory changes surrounding the head of the pancreas without focal fluid collection. The pancreas enhances normally without ductal dilatation. Spleen: Normal in size without focal abnormality. Adrenals/Urinary Tract: Both adrenal glands appear normal. The kidneys appear normal without evidence of urinary tract calculus, suspicious lesion or hydronephrosis. No bladder abnormalities  are seen. Stomach/Bowel: No evidence of bowel wall thickening, distention or surrounding inflammatory change. The appendix appears normal. Vascular/Lymphatic: There are no enlarged abdominal or pelvic lymph nodes. No significant vascular findings. The portal, superior mesenteric and splenic veins are patent. Reproductive: The prostate gland and seminal vesicles appear normal. Other: As above, similar inflammatory changes surrounding the pancreatic head, distal stomach and proximal duodenum. No focal fluid collection, ascites or free air. The anterior abdominal wall is intact. Musculoskeletal: No acute or significant osseous findings. IMPRESSION: 1. Similar findings to prior study of 2 weeks ago, most consistent with interstitial edematous pancreatitis. No evidence of pancreatic necrosis or hemorrhage. 2. Mild hepatic steatosis with focal sparing posteriorly in the left lobe. 3. No new findings. Electronically Signed   By: Carey Bullocks M.D.   On: 04/14/2018 14:26   Ct Abdomen Pelvis W Contrast  Result Date: 03/30/2018 CLINICAL DATA:  Right side abdominal pain since Thursday, some nausea, denies vomiting or diarrhea. Last [facility] Wednesday. Tachycardic No surgical hx. EXAM: CT ABDOMEN AND PELVIS WITH CONTRAST TECHNIQUE: Multidetector CT imaging of the abdomen and pelvis was performed using the standard protocol following bolus administration of intravenous contrast. CONTRAST:  ISOVUE-300 IOPAMIDOL (ISOVUE-300) INJECTION 61% COMPARISON:  08/29/2016 FINDINGS: Lower chest: Clear lung bases.  Heart normal in size. Hepatobiliary: Liver mildly enlarged with diffuse decreased attenuation consistent with fatty infiltration. No liver mass or focal lesion. Normal gallbladder. No bile duct dilation Pancreas: Hazy opacity is noted in the  peripancreatic fat. Fluid attenuation is noted adjacent to the pancreatic head and second to third portion of the duodenum. Pancreas is size. No pancreatic mass. There is homogeneous  pancreatic enhancement. No evidence of an abscess or pseudocyst. Spleen: Normal in size without focal abnormality. Adrenals/Urinary Tract: Adrenal glands are unremarkable. Kidneys are normal, without renal calculi, focal lesion, or hydronephrosis. Bladder is unremarkable. Stomach/Bowel: Stomach is unremarkable. The 2nd to 3rd portion of the duodenum shows wall thickening, most prominently adjacent to pancreatic head and uncinate process. There is surrounding inflammatory change. Small bowel is normal in caliber. No wall thickening or inflammation. Colon is normal in caliber. There several sigmoid colon diverticula. No diverticulitis or other colonic inflammatory process. Normal appendix visualized. Vascular/Lymphatic: Minor aortic atherosclerosis. Portal vein, splenic vein and superior mesenteric vein are widely patent. No lymphadenopathy. Reproductive: Unremarkable. Other: No abdominal wall hernia or abnormality. No abdominopelvic ascites. Musculoskeletal: No acute or significant osseous findings. IMPRESSION: 1. There is inflammation surrounding the pancreas, most prominently adjacent to the pancreatic head, as well as inflammation along the 2nd to 3rd portion the duodenum, associated with duodenum wall thickening. Findings are most likely due to pancreatitis with reactive edema of the duodenal wall. Consider duodenitis in the proper clinical setting. No evidence of an abscess or pseudocyst, pancreatic necrosis or other complication. 2. No other acute abnormality within the abdomen or pelvis. 3. Mild hepatomegaly and diffuse hepatic steatosis. 4. Minor aortic atherosclerosis. Electronically Signed   By: Amie Portland M.D.   On: 03/30/2018 14:37   US Abdomen Limited Ruq  Result Date: 04/13/2018 CLINICAL DATA:  Abdominal pain.  Recent pancreatitis EXAM: ULTRASOUND ABDOMEN LIMITED RIGHT UPPER QUADRANT COMPARISON:  CT abdomen and pelvis March 30, 2018 FINDINGS: Gallbladder: No gallstones or wall thickening  visualized. There is no pericholecystic fluid. No sonographic Murphy sign noted by sonographer. Common bile duct: Diameter: 4 mm. No intrahepatic or extrahepatic biliary duct dilatation. Liver: There is a hypoechoic area in the right lobe of the liver measuring 5.1 x 3.9 x 3.5 cm which was not appreciable on recent CT. The overall echotexture of the liver is mildly increased, felt to represent a degree of hepatic steatosis. Portal vein is patent on color Doppler imaging with normal direction of blood flow towards the liver. IMPRESSION: Evidence of a degree of hepatic steatosis. Hypoechoic area in the right lobe of the liver measuring 5.1 x 3.89 x 3.5 cm, not appreciable on recent abdominal CT. This area may represent localized fatty sparing. Given an apparent discrepancy between the ultrasound and CT, a followup ultrasound of the liver in 6 months to assess for stability may be warranted. Study otherwise unremarkable. Electronically Signed   By: Bretta Bang III M.D.   On: 04/13/2018 09:29    Micro Results     No results found for this or any previous visit (from the past 240 hour(s)).     Today   Subjective:   Tajuan Dufault today has no headache,no chest abdominal pain,no new weakness tingling or numbness, feels much better wants to go home today.   Objective:   Blood pressure 127/81, pulse 93, temperature 98.6 F (37 C), temperature source Oral, resp. rate 20, height 5\' 9"  (1.753 m), weight 95.3 kg, SpO2 97 %.  No intake or output data in the 24 hours ending 04/19/18 1821  Exam Awake Alert, Oriented x 3, No new F.N deficits, Normal affect [place].AT,PERRAL Supple Neck,No JVD, No cervical lymphadenopathy appriciated.  Symmetrical Chest wall movement, Good air movement bilaterally, CTAB  RRR,No Gallops,Rubs or new Murmurs, No Parasternal Heave +ve B.Sounds, Abd Soft, Non tender, No organomegaly appriciated, No rebound -guarding or rigidity. No Cyanosis, Clubbing or edema, No new Rash  or bruise  Data Review   CBC w Diff:  Lab Results  Component Value Date   WBC 15.2 (H) 04/14/2018   HGB 13.6 04/14/2018   HGB 14.5 07/07/2011   HCT 41.0 04/14/2018   HCT 43.2 07/07/2011   PLT 346 04/14/2018   PLT 354 07/07/2011   LYMPHOPCT 11 03/30/2018   MONOPCT 7 03/30/2018   EOSPCT 1 03/30/2018   BASOPCT 0 03/30/2018    CMP:  Lab Results  Component Value Date   NA 133 (L) 04/14/2018   NA 142 07/07/2011   K 4.2 04/14/2018   K 4.0 07/07/2011   CL 103 04/14/2018   CL 109 (H) 07/07/2011   CO2 22 04/14/2018   CO2 26 07/07/2011   BUN 8 04/14/2018   BUN 12 07/07/2011   CREATININE 0.54 (L) 04/14/2018   CREATININE 1.03 07/07/2011   PROT 7.0 04/14/2018   PROT 7.3 07/07/2011   ALBUMIN 3.8 04/14/2018   ALBUMIN 4.1 07/07/2011   BILITOT 1.2 04/14/2018   BILITOT 0.4 07/07/2011   ALKPHOS 47 04/14/2018   ALKPHOS 50 07/07/2011   AST 13 (L) 04/14/2018   AST 28 07/07/2011   ALT 28 04/14/2018   ALT 56 07/07/2011  .   Total Time in preparing paper work, data evaluation and todays exam - 35 minutes  Katha HammingSnehalatha Morningstar Toft M.D on 04/15/2018 at 6:21 PM    Note: This dictation was prepared with Dragon dictation along with smaller phrase technology. Any transcriptional errors that result from this process are unintentional.

## 2018-05-01 IMAGING — CR DG FOOT COMPLETE 3+V*L*
3 series · 3 of 3 positions shown · non-contrast
Comparison: None.

CLINICAL DATA: Stepped on nail

EXAM:
LEFT FOOT - COMPLETE 3+ VIEW

[foot ap]
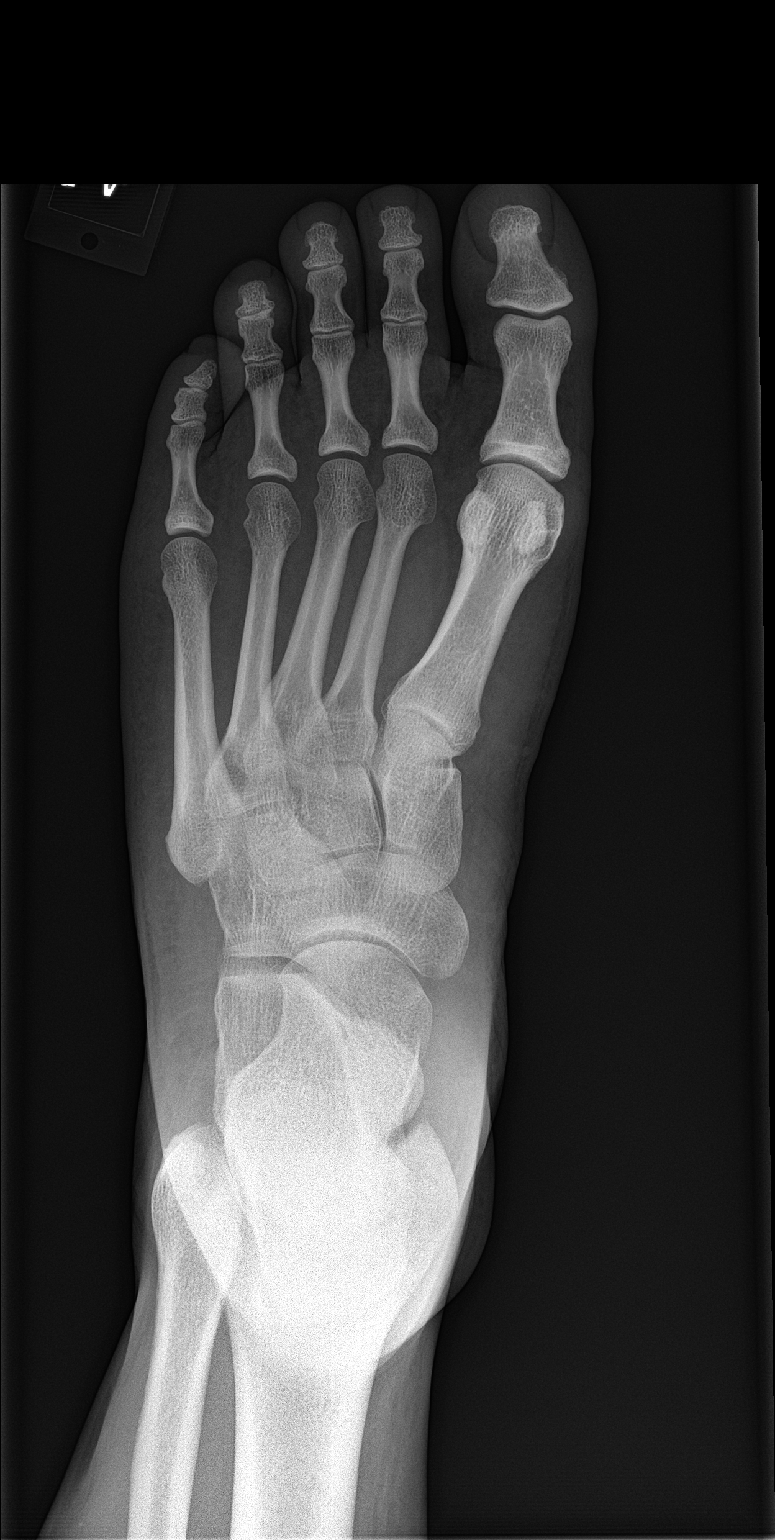

[foot obl]
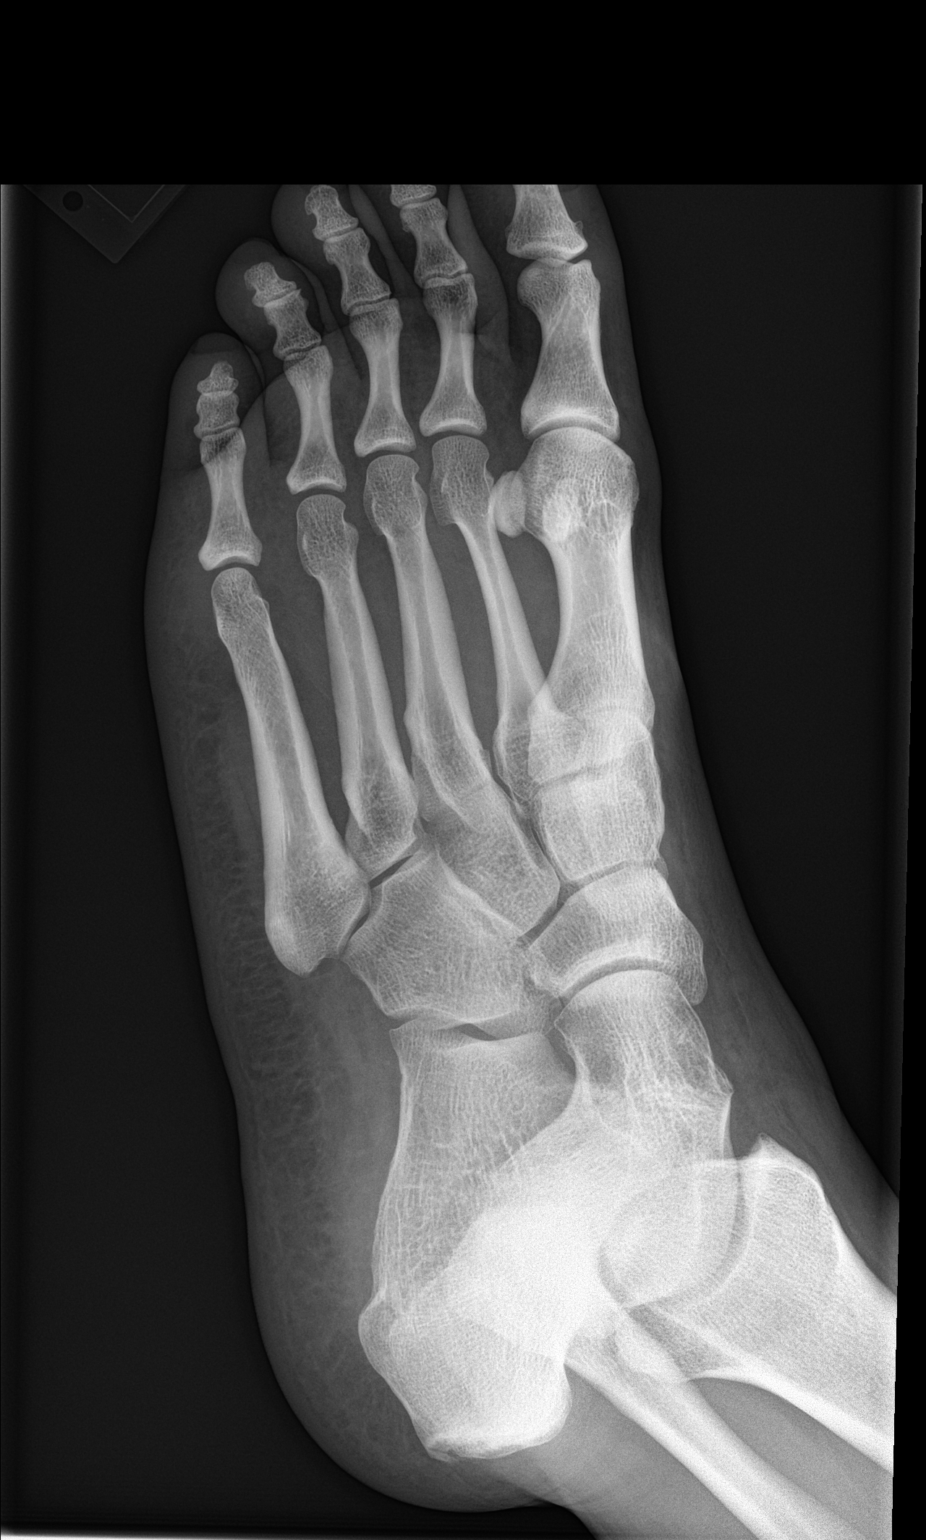

[foot lat]
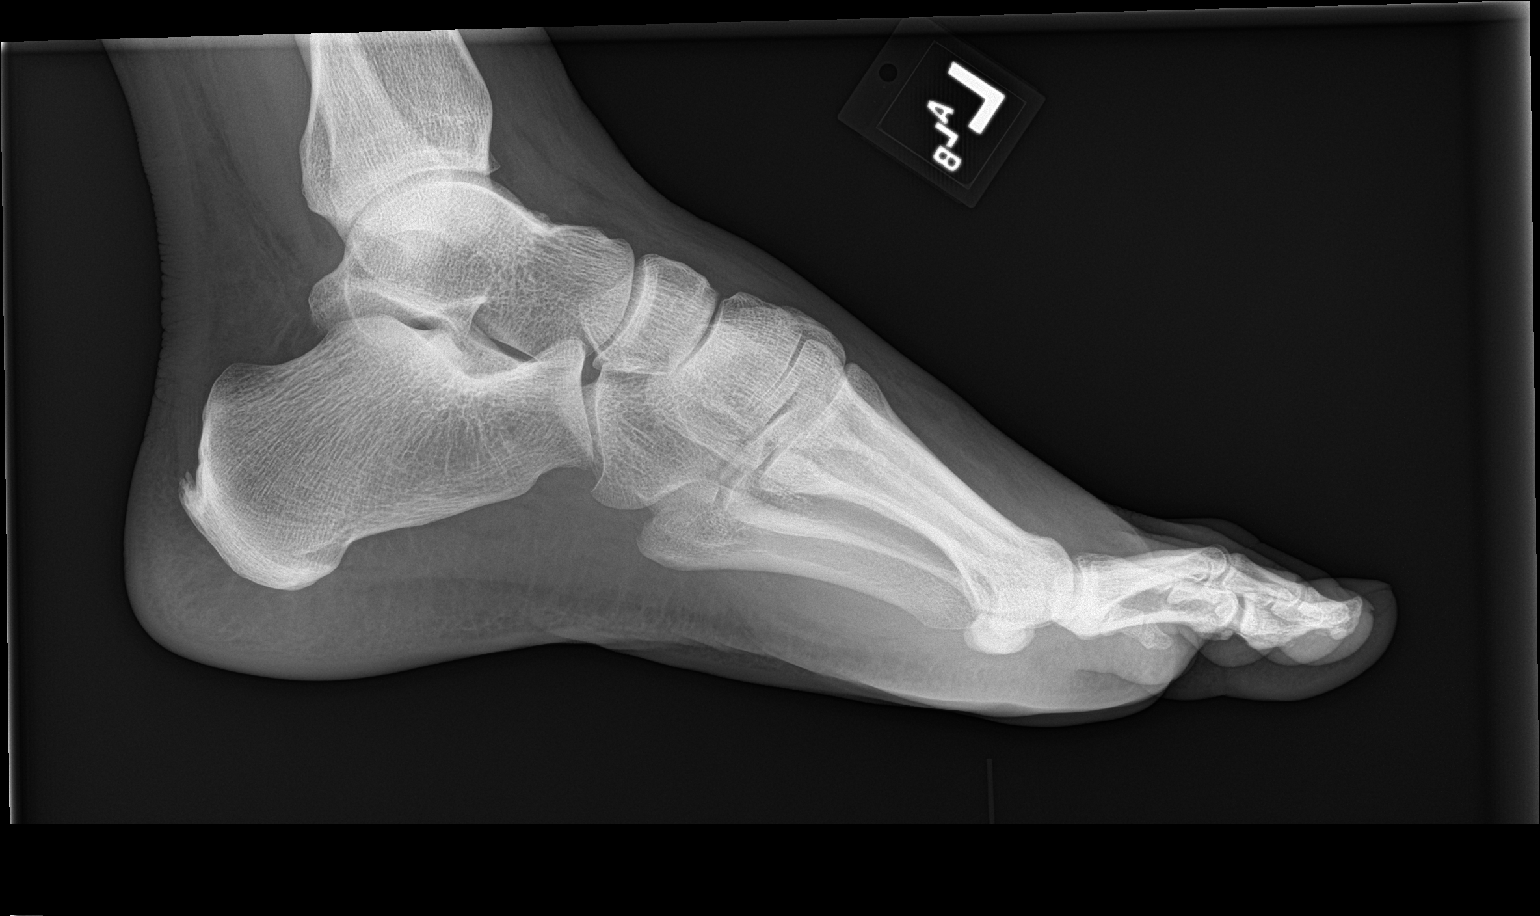

[3 of 3 positions shown; findings below may reference images not displayed]

FINDINGS: No fracture or dislocation is seen.

The joint spaces are preserved.

Mild soft tissue swelling along the plantar aspect of the forefoot.

Radiopaque foreign body is seen.
IMPRESSION: Mild soft tissue swelling along the plantar aspect of the forefoot.

No fracture, dislocation, or radiopaque foreign body is seen.

## 2019-11-06 ENCOUNTER — Encounter: Payer: Self-pay | Admitting: Emergency Medicine

## 2019-11-06 ENCOUNTER — Other Ambulatory Visit: Payer: Self-pay

## 2019-11-06 ENCOUNTER — Ambulatory Visit
Admission: EM | Admit: 2019-11-06 | Discharge: 2019-11-06 | Disposition: A | Discharge status: Home / Self Care | Payer: BC Managed Care – PPO | Attending: Physician Assistant | Admitting: Physician Assistant

## 2019-11-06 DIAGNOSIS — H66001 Acute suppurative otitis media without spontaneous rupture of ear drum, right ear: Secondary | ICD-10-CM | POA: Diagnosis not present

## 2019-11-06 DIAGNOSIS — H60501 Unspecified acute noninfective otitis externa, right ear: Secondary | ICD-10-CM

## 2019-11-06 MED ORDER — OFLOXACIN 0.3 % OT SOLN: 10.0000 [drp] | Freq: Every day | OTIC | 0 refills | Status: AC

## 2019-11-06 MED ORDER — CEFDINIR 300 MG PO CAPS: 300.0000 mg | ORAL_CAPSULE | Freq: Two times a day (BID) | ORAL | 0 refills | Status: AC

## 2019-11-06 MED ORDER — FLUTICASONE PROPIONATE 50 MCG/ACT NA SUSP: 1.0000 | Freq: Two times a day (BID) | NASAL | 0 refills | Status: DC

## 2019-11-06 NOTE — ED Triage Notes (Signed)
Patient in today c/o right ear feeling clogged up x this morning. Patient has used OTC Debrox without relief.

## 2019-11-06 NOTE — Discharge Instructions (Addendum)
OTITIS EXTERNA: On exam, you have Swimmer's ear. Please see attached discharge notes and instructions including how to use ear drops. Avoid swimming until symptoms resolve. May take Tylenol/Motrin for ear pain, but ear drops should begin to improve pain in 1-3 days. Return or visit PCP if symptoms worsen or do not improve with prescribed ear drops.   OTITIS MEDIA:  Use medications as directed for ear infection. Take full course of antibiotic. May apply warm compresses to ear for symptoms. Take Tylenol/NSAIDs for ear pain and fever. Increase rest and fluids. -If you have any questions or concerns, please call us or stop back to see [place] at any time and we will be happy to help you. If symptoms acutely worsen, follow up with our office immediately or go to the ENT or ER

## 2019-11-06 NOTE — ED Provider Notes (Signed)
[facility]    CSN: [id number] Arrival date & time: 11/06/19  1607      History   Chief Complaint Chief Complaint  Patient presents with  . Ear Fullness    right    HPI Kevin Harris is a 44 y.o. male.   43 year old male presents for right-sided ear pain and pressure with sudden onset today.  He says that his sinuses been draining for a while.  He says that he believes there could be a lot of earwax in the ear.  Apparently a few coworkers looked into his ear with a flashlight and saw earwax so he left work and came to the urgent care.  He denies any associated fever, fatigue, cough, congestion, sore throat.  He admits to postnasal drainage.  He has not taken any over-the-counter medications.  He says that he does not have Covid and does not want to be Covid tested.  No other concerns today.     Past Medical History:  Diagnosis Date  . Diabetes mellitus without complication (HCC)   . GERD (gastroesophageal reflux disease)     Patient Active Problem List   Diagnosis Date Noted  . Acute pancreatitis 03/30/2018    Past Surgical History:  Procedure Laterality Date  . WRIST SURGERY Right    severe laceration       Home Medications    Prior to Admission medications   Medication Sig Start Date End Date Taking? Authorizing Provider  fenofibrate 160 MG tablet Take 1 tablet (160 mg total) by mouth daily. 04/15/18  Yes Katha Hamming, MD  glipiZIDE (GLUCOTROL XL) 5 MG 24 hr tablet Take by mouth. 09/02/16 11/06/19 Yes [provider]  metFORMIN (GLUCOPHAGE) 500 MG tablet Take 1 tablet (500 mg total) by mouth 2 (two) times daily with a meal. 08/29/16 11/06/19 Yes Emily Filbert, MD  omeprazole (PRILOSEC) 40 MG capsule Take 1 capsule (40 mg total) by mouth daily. 04/15/18 11/06/19 Yes Katha Hamming, MD  sildenafil (VIAGRA) 100 MG tablet Take 100 mg by mouth as needed. 09/20/17  Yes [provider]  cefdinir (OMNICEF) 300 MG capsule Take 1  capsule (300 mg total) by mouth 2 (two) times daily for 10 days. 11/06/19 11/16/19  Eusebio Friendly B, PA-C  cyclobenzaprine (FLEXERIL) 10 MG tablet Take 1/2 to 1 tablet by mouth up to 3 times a day as needed for muscle pain/spasms. Patient not taking: Reported on 03/30/2018 08/20/16   Sudie Grumbling, NP  fluticasone [facility]) 50 MCG/ACT nasal spray Place 1 spray into both nostrils in the morning and at bedtime for 10 days. 11/06/19 11/16/19  Shirlee Latch, PA-C  ofloxacin (FLOXIN) 0.3 % OTIC solution Place 10 drops into the right ear daily for 7 days. 11/06/19 11/13/19  Eusebio Friendly B, PA-C  polyethylene glycol (MIRALAX / GLYCOLAX) packet Take 17 g by mouth daily. 08/29/16   Emily Filbert, MD    Family History Family History  Problem Relation Age of Onset  . Healthy Mother   . Diabetes Mother   . Healthy Father     Social History Social History   Tobacco Use  . Smoking status: Current Every Day Smoker    Packs/day: 1.00    Years: 24.00    Pack years: 24.00    Types: Cigarettes  . Smokeless tobacco: Never Used  Vaping Use  . Vaping Use: Never used  Substance Use Topics  . Alcohol use: Not Currently    Comment: a pint every week-  drinks Tequila   11/06/19 last use 33 days ago  . Drug use: Yes    Frequency: 7.0 times per week    Types: Marijuana    Comment: once daily     Allergies   Patient has no known allergies.   Review of Systems Review of Systems  Constitutional: Negative for chills, fatigue and fever.  HENT: Positive for ear pain and postnasal drip. Negative for congestion, ear discharge, hearing loss and sore throat.   Eyes: Negative for pain and visual disturbance.  Respiratory: Negative for cough and shortness of breath.   Cardiovascular: Negative for chest pain and palpitations.  Gastrointestinal: Negative for abdominal pain, diarrhea, nausea and vomiting.  Genitourinary: Negative for dysuria and hematuria.  Musculoskeletal: Negative for arthralgias and back  pain.  Skin: Negative for color change and rash.  Neurological: Negative for seizures and syncope.  All other systems reviewed and are negative.    Physical Exam Triage Vital Signs ED Triage Vitals [11/06/19 1620]  Enc Vitals Group     BP (!) 150/94     Pulse Rate 96     Resp 18     Temp 98.2 F (36.8 C)     Temp Source Oral     SpO2 99 %     Weight 220 lb (99.8 kg)     Height 5\' 10"  (1.778 m)     Head Circumference      Peak Flow      Pain Score 0     Pain Loc      Pain Edu?      Excl. in GC?    No data found.  Updated Vital Signs BP (!) 150/94 (BP Location: Left Arm)   Pulse 96   Temp 98.2 F (36.8 C) (Oral)   Resp 18   Ht 5\' 10"  (1.778 m)   Wt 220 lb (99.8 kg)   SpO2 99%   BMI 31.57 kg/m        Physical Exam Vitals and nursing note reviewed.  Constitutional:      General: He is not in acute distress.    Appearance: Normal appearance. He is well-developed. He is not toxic-appearing.  HENT:     Head: Normocephalic and atraumatic.     Right Ear: Hearing normal. Swelling and tenderness present. No drainage. Tympanic membrane is erythematous and bulging.     Left Ear: Hearing, tympanic membrane, ear canal and external ear normal.     Mouth/Throat:     Mouth: Mucous membranes are moist.     Pharynx: Oropharynx is clear. No posterior oropharyngeal erythema.  Eyes:     Conjunctiva/sclera: Conjunctivae normal.  Cardiovascular:     Rate and Rhythm: Normal rate and regular rhythm.     Heart sounds: Normal heart sounds. No murmur heard.   Pulmonary:     Effort: Pulmonary effort is normal. No respiratory distress.     Breath sounds: Normal breath sounds.  Musculoskeletal:     Cervical back: Neck supple.  Skin:    General: Skin is warm and dry.  Neurological:     General: No focal deficit present.     Mental Status: He is alert. Mental status is at baseline.     Motor: No weakness.     Gait: Gait normal.  Psychiatric:        Mood and Affect: Mood normal.         Behavior: Behavior normal.        Thought Content: Thought content  normal.      UC Treatments / Results  Labs (all labs ordered are listed, but only abnormal results are displayed) Labs Reviewed - No data to display  EKG   Radiology No results found.  Procedures Procedures (including critical care time)  Medications Ordered in UC Medications - No data to display  Initial Impression / Assessment and Plan / UC Course  I have reviewed the triage vital signs and the nursing notes.  Pertinent labs & imaging results that were available during my care of the patient were reviewed by me and considered in my medical decision making (see chart for details).   Exam consistent with otitis media as well as likely otitis externa.  Prescribed cefdinir and ofloxacin drops.  Patient declined Covid testing at this time.  Also sent fluticasone advised over-the-counter decongestants.  Follow-up as needed especially if he develops any other symptoms such as cough, fever, or shortness of breath.  Final Clinical Impressions(s) / UC Diagnoses   Final diagnoses:  Non-recurrent acute suppurative otitis media of right ear without spontaneous rupture of tympanic membrane  Acute otitis externa of right ear, unspecified type     Discharge Instructions     OTITIS EXTERNA: On exam, you have Swimmer's ear. Please see attached discharge notes and instructions including how to use ear drops. Avoid swimming until symptoms resolve. May take Tylenol/Motrin for ear pain, but ear drops should begin to improve pain in 1-3 days. Return or visit PCP if symptoms worsen or do not improve with prescribed ear drops.   OTITIS MEDIA:  Use medications as directed for ear infection. Take full course of antibiotic. May apply warm compresses to ear for symptoms. Take Tylenol/NSAIDs for ear pain and fever. Increase rest and fluids. -If you have any questions or concerns, please call us or stop back to see [place] at any time  and we will be happy to help you. If symptoms acutely worsen, follow up with our office immediately or go to the ENT or ER     ED Prescriptions    Medication Sig Dispense Auth. Provider   cefdinir (OMNICEF) 300 MG capsule Take 1 capsule (300 mg total) by mouth 2 (two) times daily for 10 days. 20 capsule Eusebio Friendly B, PA-C   fluticasone (FLONASE) 50 MCG/ACT nasal spray Place 1 spray into both nostrils in the morning and at bedtime for 10 days. 1 g Eusebio Friendly B, PA-C   ofloxacin (FLOXIN) 0.3 % OTIC solution Place 10 drops into the right ear daily for 7 days. 5 mL Shirlee Latch, PA-C     PDMP not reviewed this encounter.   Shirlee Latch, PA-C 11/06/19 1701

## 2020-01-18 ENCOUNTER — Encounter: Payer: Self-pay | Admitting: Emergency Medicine

## 2020-01-18 ENCOUNTER — Other Ambulatory Visit: Payer: Self-pay

## 2020-01-18 ENCOUNTER — Ambulatory Visit
Admission: EM | Admit: 2020-01-18 | Discharge: 2020-01-18 | Disposition: A | Discharge status: Home / Self Care | Payer: BC Managed Care – PPO | Attending: Family Medicine | Admitting: Family Medicine

## 2020-01-18 DIAGNOSIS — N342 Other urethritis: Secondary | ICD-10-CM | POA: Insufficient documentation

## 2020-01-18 LAB — URINALYSIS, COMPLETE (UACMP) WITH MICROSCOPIC
Bacteria, UA: NONE SEEN
Bilirubin Urine: NEGATIVE
Glucose, UA: NEGATIVE mg/dL
Leukocytes,Ua: NEGATIVE
Nitrite: NEGATIVE
Protein, ur: 30 mg/dL — AB
Specific Gravity, Urine: 1.025 (ref 1.005–1.030)
Squamous Epithelial / HPF: NONE SEEN (ref 0–5)
pH: 5.5 (ref 5.0–8.0)

## 2020-01-18 LAB — CHLAMYDIA/NGC RT PCR (ARMC ONLY)
Chlamydia Tr: NOT DETECTED
N gonorrhoeae: NOT DETECTED

## 2020-01-18 MED ORDER — CEFTRIAXONE SODIUM 500 MG IJ SOLR
500.0000 mg | Freq: Once | INTRAMUSCULAR | Status: AC
  Administered 2020-01-18: 500 mg via INTRAMUSCULAR

## 2020-01-18 MED ORDER — DOXYCYCLINE HYCLATE 100 MG PO CAPS: 100.0000 mg | ORAL_CAPSULE | Freq: Two times a day (BID) | ORAL | 0 refills | Status: DC

## 2020-01-18 NOTE — ED Provider Notes (Signed)
[facility]    CSN: [id number] Arrival date & time: 01/18/20  1451      History   Chief Complaint Chief Complaint  Patient presents with  . Dysuria  . Penile Discharge   HPI  44 year old male presents with the above complaints.  Patient states that his symptoms started today.  He reports that he has had dysuria.  He is also had some penile discharge.  He has recently had unprotected intercourse.  He is concerned about STD.  He states that his pain is 8/10 in severity.  No relieving factors.  No other reported symptoms.  No other complaints.  Past Medical History:  Diagnosis Date  . Diabetes mellitus without complication (HCC)   . GERD (gastroesophageal reflux disease)     Patient Active Problem List   Diagnosis Date Noted  . Acute pancreatitis 03/30/2018    Past Surgical History:  Procedure Laterality Date  . WRIST SURGERY Right    severe laceration   Home Medications    Prior to Admission medications   Medication Sig Start Date End Date Taking? Authorizing Provider  glipiZIDE (GLUCOTROL XL) 5 MG 24 hr tablet Take by mouth. 09/02/16 01/18/20 Yes [provider]  metFORMIN (GLUCOPHAGE) 500 MG tablet Take 1 tablet (500 mg total) by mouth 2 (two) times daily with a meal. 08/29/16 01/18/20 Yes Emily Filbert, MD  omeprazole (PRILOSEC) 40 MG capsule Take 1 capsule (40 mg total) by mouth daily. 04/15/18 01/18/20 Yes Katha Hamming, MD  fenofibrate 160 MG tablet Take 1 tablet (160 mg total) by mouth daily. 04/15/18 01/18/20 Yes Katha Hamming, MD  doxycycline (VIBRAMYCIN) 100 MG capsule Take 1 capsule (100 mg total) by mouth 2 (two) times daily. 01/18/20   Tommie Sams, DO  fluticasone (FLONASE) 50 MCG/ACT nasal spray Place 1 spray into both nostrils in the morning and at bedtime for 10 days. 11/06/19 11/16/19  Eusebio Friendly B, PA-C  sildenafil (VIAGRA) 100 MG tablet Take 100 mg by mouth as needed. 09/20/17 01/18/20  [provider]     Family History Family History  Problem Relation Age of Onset  . Healthy Mother   . Diabetes Mother   . Healthy Father     Social History Social History   Tobacco Use  . Smoking status: Current Every Day Smoker    Packs/day: 1.00    Years: 24.00    Pack years: 24.00    Types: Cigarettes  . Smokeless tobacco: Never Used  Vaping Use  . Vaping Use: Never used  Substance Use Topics  . Alcohol use: Yes    Comment: a pint every week- drinks Tequila   11/06/19 last use 33 days ago  . Drug use: Yes    Frequency: 7.0 times per week    Types: Marijuana    Comment: once daily     Allergies   Patient has no known allergies.   Review of Systems Review of Systems  Genitourinary: Positive for discharge and dysuria.   Physical Exam Triage Vital Signs ED Triage Vitals  Enc Vitals Group     BP 01/18/20 1509 (!) 170/100     Pulse Rate 01/18/20 1509 90     Resp 01/18/20 1509 18     Temp 01/18/20 1509 98 F (36.7 C)     Temp Source 01/18/20 1509 Oral     SpO2 01/18/20 1509 100 %     Weight 01/18/20 1506 214 lb (97.1 kg)     Height 01/18/20 1506 5'  10" (1.778 m)     Head Circumference --      Peak Flow --      Pain Score 01/18/20 1506 8     Pain Loc --      Pain Edu? --      Excl. in GC? --    Updated Vital Signs BP (!) 170/100 (BP Location: Left Arm)   Pulse 90   Temp 98 F (36.7 C) (Oral)   Resp 18   Ht 5\' 10"  (1.778 m)   Wt 97.1 kg   SpO2 100%   BMI 30.71 kg/m   Visual Acuity Right Eye Distance:   Left Eye Distance:   Bilateral Distance:    Right Eye Near:   Left Eye Near:    Bilateral Near:     Physical Exam Vitals and nursing note reviewed.  Constitutional:      General: He is not in acute distress.    Appearance: Normal appearance. He is not ill-appearing.  HENT:     Head: Normocephalic and atraumatic.  Eyes:     General:        Right eye: No discharge.        Left eye: No discharge.     Conjunctiva/sclera: Conjunctivae normal.  Pulmonary:      Effort: Pulmonary effort is normal. No respiratory distress.  Genitourinary:    Penis: Normal.      Testes: Normal.  Neurological:     Mental Status: He is alert.  Psychiatric:        Mood and Affect: Mood normal.        Behavior: Behavior normal.    UC Treatments / Results  Labs (all labs ordered are listed, but only abnormal results are displayed) Labs Reviewed  URINALYSIS, COMPLETE (UACMP) WITH MICROSCOPIC - Abnormal; Notable for the following components:      Result Value   Hgb urine dipstick SMALL (*)    Ketones, ur TRACE (*)    Protein, ur 30 (*)    All other components within normal limits  CHLAMYDIA/NGC RT PCR [facility] ONLY)    EKG   Radiology No results found.  Procedures Procedures (including critical care time)  Medications Ordered in UC Medications  cefTRIAXone (ROCEPHIN) injection 500 mg (500 mg Intramuscular Given 01/18/20 1602)    Initial Impression / Assessment and Plan / UC Course  I have reviewed the triage vital signs and the nursing notes.  Pertinent labs & imaging results that were available during my care of the patient were reviewed by me and considered in my medical decision making (see chart for details).    44 year old male presents with penile discharge.  Suspected urethritis.  Normal urinalysis.  Discussed waiting on test results versus empiric treatment for STD.  Patient elected for empiric treatment.  Rocephin given.  Sending home on doxycycline.  Final Clinical Impressions(s) / UC Diagnoses   Final diagnoses:  Urethritis   Discharge Instructions   None    ED Prescriptions    Medication Sig Dispense Auth. Provider   doxycycline (VIBRAMYCIN) 100 MG capsule Take 1 capsule (100 mg total) by mouth 2 (two) times daily. 14 capsule 45 G, DO     PDMP not reviewed this encounter.   Everlene Other, Tommie Sams 01/18/20 1639

## 2020-01-18 NOTE — ED Triage Notes (Addendum)
Patient c/o burning with urination and penile discharge that started today. He is concerned for STD's

## 2020-07-14 ENCOUNTER — Ambulatory Visit
Admission: EM | Admit: 2020-07-14 | Discharge: 2020-07-14 | Disposition: A | Discharge status: Home / Self Care | Payer: 59 | Attending: Emergency Medicine | Admitting: Emergency Medicine

## 2020-07-14 ENCOUNTER — Other Ambulatory Visit: Payer: Self-pay

## 2020-07-14 DIAGNOSIS — N433 Hydrocele, unspecified: Secondary | ICD-10-CM

## 2020-07-14 NOTE — ED Provider Notes (Signed)
[facility]    CSN: [id number] Arrival date & time: 07/14/20  0932      History   Chief Complaint Chief Complaint  Patient presents with  . Groin Swelling    HPI Kevin Harris is a 45 y.o. male.   HPI   45 year old male here for evaluation of scrotal swelling.  Patient reports that he has noticed increased to the left side of his scrotum for "a while" but yesterday when he was getting dressed he noticed that the left side of his scrotum was much larger.  Patient denies pain in the testicle, pain with urination, or fever.  Patient's had no abdominal pain, blood in his urine, or blood in his ejaculate.  Past Medical History:  Diagnosis Date  . Diabetes mellitus without complication (HCC)   . GERD (gastroesophageal reflux disease)     Patient Active Problem List   Diagnosis Date Noted  . Acute pancreatitis 03/30/2018    Past Surgical History:  Procedure Laterality Date  . WRIST SURGERY Right    severe laceration       Home Medications    Prior to Admission medications   Medication Sig Start Date End Date Taking? Authorizing Provider  glipiZIDE (GLUCOTROL XL) 5 MG 24 hr tablet Take by mouth. 09/02/16 07/14/20 Yes [provider]  metFORMIN (GLUCOPHAGE) 500 MG tablet Take 1 tablet (500 mg total) by mouth 2 (two) times daily with a meal. 08/29/16 01/18/20 Yes Emily Filbert, MD  omeprazole (PRILOSEC) 40 MG capsule Take 1 capsule (40 mg total) by mouth daily. 04/15/18 01/18/20 Yes Katha Hamming, MD  fenofibrate 160 MG tablet Take 1 tablet (160 mg total) by mouth daily. 04/15/18 01/18/20  Katha Hamming, MD  fluticasone (FLONASE) 50 MCG/ACT nasal spray Place 1 spray into both nostrils in the morning and at bedtime for 10 days. 11/06/19 07/14/20  Eusebio Friendly B, PA-C  sildenafil (VIAGRA) 100 MG tablet Take 100 mg by mouth as needed. 09/20/17 01/18/20  [provider]    Family History Family History  Problem Relation Age of  Onset  . Healthy Mother   . Diabetes Mother   . Healthy Father     Social History Social History   Tobacco Use  . Smoking status: Current Every Day Smoker    Packs/day: 1.00    Years: 24.00    Pack years: 24.00    Types: Cigarettes  . Smokeless tobacco: Never Used  Vaping Use  . Vaping Use: Never used  Substance Use Topics  . Alcohol use: Yes    Comment: a pint every week- drinks Tequila   11/06/19 last use 33 days ago  . Drug use: Yes    Frequency: 7.0 times per week    Types: Marijuana    Comment: once daily     Allergies   Patient has no known allergies.   Review of Systems Review of Systems  Genitourinary: Positive for scrotal swelling. Negative for hematuria, penile discharge, penile pain, penile swelling and testicular pain.     Physical Exam Triage Vital Signs ED Triage Vitals  Enc Vitals Group     BP 07/14/20 1054 (!) 147/99     Pulse Rate 07/14/20 1054 95     Resp 07/14/20 1054 18     Temp 07/14/20 1054 98.1 F (36.7 C)     Temp Source 07/14/20 1054 Oral     SpO2 07/14/20 1054 95 %     Weight 07/14/20 1053 220 lb (99.8 kg)  Height 07/14/20 1053 5\' 8"  (1.727 m)     Head Circumference --      Peak Flow --      Pain Score 07/14/20 1052 0     Pain Loc --      Pain Edu? --      Excl. in GC? --    No data found.  Updated Vital Signs BP (!) 147/99 (BP Location: Left Arm)   Pulse 95   Temp 98.1 F (36.7 C) (Oral)   Resp 18   Ht 5\' 8"  (1.727 m)   Wt 220 lb (99.8 kg)   SpO2 95%   BMI 33.45 kg/m   Visual Acuity Right Eye Distance:   Left Eye Distance:   Bilateral Distance:    Right Eye Near:   Left Eye Near:    Bilateral Near:     Physical Exam Vitals and nursing note reviewed.  Constitutional:      General: He is not in acute distress.    Appearance: Normal appearance. He is not ill-appearing.  HENT:     Head: Normocephalic and atraumatic.  Cardiovascular:     Rate and Rhythm: Normal rate and regular rhythm.     Pulses: Normal  pulses.     Heart sounds: Normal heart sounds. No murmur heard. No gallop.   Pulmonary:     Effort: Pulmonary effort is normal.     Breath sounds: Normal breath sounds. No wheezing, rhonchi or rales.  Genitourinary:    Penis: Normal.      Testes: Normal.  Skin:    General: Skin is warm and dry.     Capillary Refill: Capillary refill takes less than 2 seconds.     Findings: No erythema or rash.  Neurological:     General: No focal deficit present.     Mental Status: He is alert and oriented to person, place, and time.  Psychiatric:        Mood and Affect: Mood normal.        Behavior: Behavior normal.        Thought Content: Thought content normal.        Judgment: Judgment normal.      UC Treatments / Results  Labs (all labs ordered are listed, but only abnormal results are displayed) Labs Reviewed - No data to display  EKG   Radiology No results found.  Procedures Procedures (including critical care time)  Medications Ordered in UC Medications - No data to display  Initial Impression / Assessment and Plan / UC Course  I have reviewed the triage vital signs and the nursing notes.  Pertinent labs & imaging results that were available during my care of the patient were reviewed by me and considered in my medical decision making (see chart for details).   Patient is a very pleasant, nontoxic-appearing 45 year old male here for evaluation of left scrotal swelling.  He has had the swelling for some time but noticed it was much larger last night.  There is no pain associated with the swelling.  Patient has no pain with urination, no blood in his urine, no blood in his ejaculate, and no pain in his testicle.  Physical exam reveals a benign cardiopulmonary exam.  No CVA tenderness on exam.  Genital exam reveals a enlarged scrotum on the left side without erythema, or induration.  Both testicles are identical in size, smooth in texture, and nontender.  There is no tenderness or  fullness to either epididymis or spermatic cord.  There is no hernia present on exam.  Suspect patient has a hydrocele and will refer to urology.   Final Clinical Impressions(s) / UC Diagnoses   Final diagnoses:  Hydrocele in adult     Discharge Instructions     Wear supportive underwear or an athletic supporter to support your scrotum and provide comfort.  I have referred you to [facility] and they will be calling to schedule an appointment.    ED Prescriptions    None     PDMP not reviewed this encounter.   Becky Augusta, NP 07/14/20 1139

## 2020-07-14 NOTE — Discharge Instructions (Addendum)
Wear supportive underwear or an athletic supporter to support your scrotum and provide comfort.  I have referred you to [facility] and they will be calling to schedule an appointment.

## 2020-07-14 NOTE — ED Triage Notes (Signed)
Patient states that he is having left testicle swelling without pain. States that he has been noticing it swollen for a while but yesterday it grew much larger.

## 2020-07-20 ENCOUNTER — Encounter: Payer: Self-pay | Admitting: Urology

## 2020-07-20 ENCOUNTER — Ambulatory Visit (INDEPENDENT_AMBULATORY_CARE_PROVIDER_SITE_OTHER): Payer: Self-pay | Admitting: Urology

## 2020-07-20 ENCOUNTER — Other Ambulatory Visit: Payer: Self-pay

## 2020-07-20 DIAGNOSIS — N5089 Other specified disorders of the male genital organs: Secondary | ICD-10-CM

## 2020-07-20 DIAGNOSIS — R21 Rash and other nonspecific skin eruption: Secondary | ICD-10-CM

## 2020-07-20 NOTE — Progress Notes (Signed)
   07/20/20 12:41 PM   Kevin Harris 11-01-1975 [id number]  CC: Rash, left scrotal swelling  HPI: I saw Mr. Kevin Harris and his partner in urology clinic today for the above issues.  He is a 45 year old male who reports worsening scrotal swelling over the last few days.  This is nonpainful.  He has had swelling in the past, but he thinks this is more significant than it has been.  He denies any change in urinary symptoms.  He also has some new rash over his lower legs.  He has not been wearing underwear over the last few weeks which he thinks could be related.  He was seen in urgent care on 07/14/2020 and felt to have a hydrocele, but no imaging was performed, and labs were otherwise benign.  Most recent imaging was February 2020 for pancreatitis, and there were no urologic abnormalities on that film.  He denies any gross hematuria, weight loss, fevers, or other symptoms.   PMH: Past Medical History:  Diagnosis Date  . Diabetes mellitus without complication (HCC)   . GERD (gastroesophageal reflux disease)     Surgical History: Past Surgical History:  Procedure Laterality Date  . WRIST SURGERY Right    severe laceration     Family History: Family History  Problem Relation Age of Onset  . Healthy Mother   . Diabetes Mother   . Healthy Father     Social History:  reports that he has been smoking cigarettes. He has a 24.00 pack-year smoking history. He has never used smokeless tobacco. He reports current alcohol use. He reports current drug use. Frequency: 7.00 times per week. Drug: Marijuana.  Physical Exam:  Constitutional:  Alert and oriented, No acute distress. Cardiovascular: No clubbing, cyanosis, or edema. Respiratory: Normal respiratory effort, no increased work of breathing. GI: Abdomen is soft, nontender, nondistended, no abdominal masses GU: Phallus with patent meatus, no lesions, testicles 20 cc and descended bilaterally without masses, subtle left scrotal edema,  likely hydrocele, nontender, no skin changes. Lower extremities: Subtle papules on the thighs, most consistent with heat rash  Laboratory Data: Reviewed, see HPI  Pertinent Imaging: I have personally viewed and interpreted the CT from 2020 that shows no urologic abnormalities  Assessment & Plan:   45 year old male with mild left scrotal swelling that is increased from prior but no pain or other systemic symptoms.  He also has a mild thigh rash that likely is unrelated to the left scrotal swelling, may be related to heat rash from not wearing underwear over the last few weeks.  We discussed less likely etiologies like testicular tumor.  I recommended a scrotal ultrasound for better evaluation secondary to the acuity of the swelling and lack of pain that would be more consistent with epididymitis.  Exam overall relatively benign, and most consistent with a small left hydrocele  Scrotal ultrasound, will call with results   Legrand Rams, MD 07/20/2020  [facility] [address] [place], [place] [address] [phone]

## 2020-07-28 ENCOUNTER — Other Ambulatory Visit: Payer: Self-pay

## 2020-07-28 ENCOUNTER — Ambulatory Visit
Admission: RE | Admit: 2020-07-28 | Discharge: 2020-07-28 | Disposition: A | Discharge status: Home / Self Care | Payer: 59 | Source: Ambulatory Visit | Attending: Urology | Admitting: Urology

## 2020-07-28 DIAGNOSIS — N5089 Other specified disorders of the male genital organs: Secondary | ICD-10-CM | POA: Diagnosis present

## 2020-07-29 ENCOUNTER — Telehealth: Payer: Self-pay

## 2020-07-29 NOTE — Telephone Encounter (Signed)
Called pt no answer, unable to leave message as no voicemail is set up. 1st attempt.

## 2020-07-29 NOTE — Telephone Encounter (Signed)
-----   Message from Sondra Come, MD sent at 07/29/2020 10:25 AM EDT ----- No worrisome findings on testicular ultrasound, just simple fluid around the testicles which is common(hydroceles) Keep follow-up as scheduled  Legrand Rams, MD 07/29/2020

## 2020-08-03 NOTE — Telephone Encounter (Signed)
Called pt informed him of the information below, pt voiced understanding.  

## 2020-08-11 ENCOUNTER — Encounter: Payer: Self-pay | Admitting: Emergency Medicine

## 2020-08-11 ENCOUNTER — Ambulatory Visit
Admission: EM | Admit: 2020-08-11 | Discharge: 2020-08-11 | Disposition: A | Discharge status: Home / Self Care | Payer: 59 | Attending: Sports Medicine | Admitting: Sports Medicine

## 2020-08-11 ENCOUNTER — Other Ambulatory Visit: Payer: Self-pay

## 2020-08-11 DIAGNOSIS — N342 Other urethritis: Secondary | ICD-10-CM | POA: Diagnosis present

## 2020-08-11 LAB — POCT URINALYSIS DIP (DEVICE)
Bilirubin Urine: NEGATIVE
Glucose, UA: NEGATIVE mg/dL
Ketones, ur: NEGATIVE mg/dL
Leukocytes,Ua: NEGATIVE
Nitrite: NEGATIVE
Protein, ur: NEGATIVE mg/dL
Specific Gravity, Urine: >=1.03 (ref 1.005–1.030)
Urobilinogen, UA: 0.2 mg/dL (ref 0.0–1.0)
pH: 5.5 (ref 5.0–8.0)

## 2020-08-11 MED ORDER — FLUCONAZOLE 200 MG PO TABS: 200.0000 mg | ORAL_TABLET | Freq: Every day | ORAL | 1 refills | Status: AC

## 2020-08-11 MED ORDER — PHENAZOPYRIDINE HCL 200 MG PO TABS: 200.0000 mg | ORAL_TABLET | Freq: Three times a day (TID) | ORAL | 0 refills | Status: DC

## 2020-08-11 NOTE — Discharge Instructions (Addendum)
Take the Pyridium every 8 hours as needed for urinary discomfort.  Take the Diflucan as a single dose in case this irritation is from a yeast infection.  We will send urine for culture and see if any bacteria grows out.  If any bacteria grows out we will treat you at that time.  If your symptoms do not improve, or they worsen, follow-up with urology.

## 2020-08-11 NOTE — ED Provider Notes (Signed)
[facility]    CSN: [id number] Arrival date & time: 08/11/20  1948      History   Chief Complaint Chief Complaint  Patient presents with   Penis Pain    HPI Kevin Harris is a 45 y.o. male.   HPI  29 old male here for evaluation of burning in his urethra and painful urination.  Patient reports that his symptoms started this morning.  He states that he has not had any symptoms like this in the past.  He denies any urinary urgency, frequency, blood in his urine, fever, or penile discharge.  Patient reports that he does not any concern for STIs because he is been married for the last 16 years and has been monogamous with his wife.  Past Medical History:  Diagnosis Date   Diabetes mellitus without complication (HCC)    GERD (gastroesophageal reflux disease)     Patient Active Problem List   Diagnosis Date Noted   Acute pancreatitis 03/30/2018    Past Surgical History:  Procedure Laterality Date   WRIST SURGERY Right    severe laceration       Home Medications    Prior to Admission medications   Medication Sig Start Date End Date Taking? Authorizing Provider  fluconazole (DIFLUCAN) 200 MG tablet Take 1 tablet (200 mg total) by mouth daily for 2 doses. 08/11/20 08/13/20 Yes Becky Augusta, NP  phenazopyridine (PYRIDIUM) 200 MG tablet Take 1 tablet (200 mg total) by mouth 3 (three) times daily. 08/11/20  Yes Becky Augusta, NP  atorvastatin (LIPITOR) 40 MG tablet Take 1 tablet by mouth daily. 07/07/20   [provider]  fenofibrate 160 MG tablet Take 160 mg by mouth daily. 07/07/20   [provider]  glipiZIDE (GLUCOTROL XL) 10 MG 24 hr tablet Take 10 mg by mouth 2 (two) times daily. 07/08/20   [provider]  metFORMIN (GLUCOPHAGE-XR) 750 MG 24 hr tablet Take 1 tablet by mouth daily with supper. 07/09/20 07/09/21  [provider]  omeprazole (PRILOSEC) 20 MG capsule Take 1 capsule by mouth daily. 07/07/20   [provider]  valsartan (DIOVAN) 80 MG tablet Take 80 mg by mouth daily. 07/07/20   [provider]  fluticasone (FLONASE) 50 MCG/ACT nasal spray Place 1 spray into both nostrils in the morning and at bedtime for 10 days. 11/06/19 07/14/20  Eusebio Friendly B, PA-C  sildenafil (VIAGRA) 100 MG tablet Take 100 mg by mouth as needed. 09/20/17 01/18/20  [provider]    Family History Family History  Problem Relation Age of Onset   Healthy Mother    Diabetes Mother    Healthy Father     Social History Social History   Tobacco Use   Smoking status: Every Day    Packs/day: 1.00    Years: 24.00    Pack years: 24.00    Types: Cigarettes   Smokeless tobacco: Never  Vaping Use   Vaping Use: Never used  Substance Use Topics   Alcohol use: Yes    Comment: a pint every week- drinks Tequila   11/06/19 last use 33 days ago   Drug use: Yes    Frequency: 7.0 times per week    Types: Marijuana    Comment: once daily     Allergies   Patient has no known allergies.   Review of Systems Review of Systems  Genitourinary:  Positive for dysuria. Negative for frequency, hematuria, penile discharge, penile pain, penile swelling, scrotal swelling,  testicular pain and urgency.    Physical Exam Triage Vital Signs ED Triage Vitals [08/11/20 1956]  Enc Vitals Group     BP      Pulse      Resp      Temp      Temp src      SpO2      Weight      Height      Head Circumference      Peak Flow      Pain Score 7     Pain Loc      Pain Edu?      Excl. in GC?    No data found.  Updated Vital Signs BP (!) 171/101 (BP Location: Left Arm)   Pulse (!) 103   Temp 98.5 F (36.9 C) (Oral)   Resp 18   SpO2 100%   Visual Acuity Right Eye Distance:   Left Eye Distance:   Bilateral Distance:    Right Eye Near:   Left Eye Near:    Bilateral Near:     Physical Exam Vitals and nursing note reviewed.  Constitutional:      General: He is not in acute distress.    Appearance: Normal  appearance. He is normal weight. He is not ill-appearing.  Genitourinary:    Penis: Normal.      Testes: Normal.  Skin:    Capillary Refill: Capillary refill takes less than 2 seconds.  Neurological:     General: No focal deficit present.     Mental Status: He is alert and oriented to person, place, and time.  Psychiatric:        Mood and Affect: Mood normal.        Behavior: Behavior normal.        Thought Content: Thought content normal.        Judgment: Judgment normal.     UC Treatments / Results  Labs (all labs ordered are listed, but only abnormal results are displayed) Labs Reviewed  POCT URINALYSIS DIP (DEVICE) - Abnormal; Notable for the following components:      Result Value   Hgb urine dipstick LARGE (*)    All other components within normal limits  URINE CULTURE    EKG   Radiology No results found.  Procedures Procedures (including critical care time)  Medications Ordered in UC Medications - No data to display  Initial Impression / Assessment and Plan / UC Course  I have reviewed the triage vital signs and the nursing notes.  Pertinent labs & imaging results that were available during my care of the patient were reviewed by me and considered in my medical decision making (see chart for details).  Patient is a nontoxic-appearing 45 year old male here for evaluation of burning in his urethra and burning with urination that started this morning and is not associated with any other urinary symptoms.  Patient's physical exam reveals a normal penile shaft without lesion or erythema, or edema.  There is no discharge from the urethral meatus and there is no irritation or rashes around the glans penis.  Patient states that he has no concerns about STIs but he did report that he had sex with his wife this week and unprotected and she had a white thick vaginal discharge which she typically has which is a yeast infection and he is concerned that he may be developed a  yeast infection in his urethra.  I told patient that it is highly unlikely that he  developed a yeast infection in his urethra without having any external evidence of a yeast infection.  Urinalysis collected at triage shows large blood but no other signs of infection.  We will send urine for culture.  We will treat patient for his urethritis with Pyridium every 8 hours to help with symptoms as well as a single dose of Diflucan to cover for any potential yeast symptoms.  Patient vies that if his symptoms do not improve, or they worsen he needs to follow-up with urology.   Final Clinical Impressions(s) / UC Diagnoses   Final diagnoses:  Urethritis   Discharge Instructions   None    ED Prescriptions     Medication Sig Dispense Auth. Provider   phenazopyridine (PYRIDIUM) 200 MG tablet Take 1 tablet (200 mg total) by mouth 3 (three) times daily. 6 tablet Becky Augusta, NP   fluconazole (DIFLUCAN) 200 MG tablet Take 1 tablet (200 mg total) by mouth daily for 2 doses. 2 tablet Becky Augusta, NP      PDMP not reviewed this encounter.   Becky Augusta, NP 08/11/20 2014

## 2020-08-11 NOTE — ED Triage Notes (Signed)
Onset yesterday of stinging and pain in urethra.  This has worsened today.

## 2020-08-13 LAB — URINE CULTURE
Culture: NO GROWTH
Special Requests: NORMAL

## 2020-08-19 ENCOUNTER — Encounter: Payer: Self-pay | Admitting: Urology

## 2020-08-19 ENCOUNTER — Other Ambulatory Visit: Payer: Self-pay

## 2020-08-19 ENCOUNTER — Ambulatory Visit (INDEPENDENT_AMBULATORY_CARE_PROVIDER_SITE_OTHER): Payer: 59 | Admitting: Urology

## 2020-08-19 VITALS — BP 135/87 | HR 105 | Ht 68.6 in | Wt 215.0 lb

## 2020-08-19 DIAGNOSIS — R300 Dysuria: Secondary | ICD-10-CM | POA: Diagnosis not present

## 2020-08-19 DIAGNOSIS — N432 Other hydrocele: Secondary | ICD-10-CM | POA: Diagnosis not present

## 2020-08-19 NOTE — Patient Instructions (Signed)
Hydrocele, Adult A hydrocele is a collection of fluid in the loose pouch of skin that holds the testicles (scrotum). This may happen because: The amount of fluid produced in the scrotum is not absorbed by the rest of the body. Fluid from the abdomen fills the scrotum. Normally, the testicles develop in the abdomen then drop into to the scrotum before birth. The tube that the testicles travel through usually closes after the testicles drop. If the tube does not close, fluid from the abdomen can fill the scrotum. This is not very common in adults. What are the causes? A hydrocele may be caused by: An injury to the scrotum. An infection. Decreased blood flow to the scrotum. Twisting of a testicle (testicular torsion). A birth defect. A tumor or cancer of the testicle. Sometimes, the cause is not known. What are the signs or symptoms? A hydrocele feels like a water-filled balloon. It may also feel heavy. Other symptoms include: Swelling of the scrotum. The swelling may decrease when you lie down. You may also notice more swelling at night than in the morning. This is called a communicating hydrocele, in which the fluid in the scrotum goes back into the abdominal cavity when the position of the scrotum changes. Swelling of the groin. Mild discomfort in the scrotum. Pain. This can develop if the hydrocele was caused by infection or twisting. The larger the hydrocele, the more likely you are to have pain. Swelling may also cause pain. How is this diagnosed? This condition may be diagnosed based on a physical exam and your medical history. You may also have tests, including: Imaging tests, such as ultrasound. A transillumination test. This test takes place in a dark room; a light is placed on the skin of the scrotum. Clear liquid will not impede the light and the scrotum will be illuminated. This helps a health care provider distinguish a hydrocele from a tumor. Blood or urine tests. How is this  treated? Most hydroceles go away on their own. If you have no discomfort or pain, your health care provider may suggest close monitoring of your condition (called watch and wait or watchful waiting) until the condition goes away or symptoms develop. If treatment is needed, it may include: Treating an underlying condition. This may include taking an antibiotic medicine to treat an infection. Having surgery to stop fluid from collecting in the scrotum. Having surgery to drain the fluid. Surgery may include: Hydrocelectomy. For this procedure, an incision is made in the scrotum to remove the fluid sac. Needle aspiration. A needle is used to drain fluid. However, the fluid buildup will come back quickly and may lead to an infection of the scrotum. This treatment is rarely used. Follow these instructions at home: Medicines Take over-the-counter and prescription medicines only as told by your health care provider. If you were prescribed an antibiotic medicine, take it as told by your health care provider. Do not stop taking the antibiotic even if you start to feel better. General instructions Watch the hydrocele for any changes. Keep all follow-up visits as told by your health care provider. This is important. Contact a health care provider if: You notice any changes in the hydrocele. The swelling in your scrotum or groin gets worse. The hydrocele becomes red, firm, painful, or tender to the touch. You have a fever. Get help right away if you: Develop a lot of pain or your pain becomes worse. Have shaking chills. Have a high fever. Summary A hydrocele is a collection   of fluid in the loose pouch of skin that holds the testicles (scrotum). A hydrocele can cause swelling, discomfort, and pain. In adults, the cause of a hydrocele may not be known. However, it is sometimes caused by an infection or a rotation and twisting of the scrotum. Treatment may not be needed. Hydroceles often go away on their  own. If a hydrocele causes pain, treatment may be given to ease the pain. This information is not intended to replace advice given to you by your health care provider. Make sure you discuss any questions you have with your healthcare provider. Document Revised: 12/12/2018 Document Reviewed: 12/12/2018 Elsevier Patient Education  2022 [employer]  

## 2020-08-19 NOTE — Progress Notes (Signed)
   08/19/2020 9:41 AM   Kevin Harris [birth year] [id number]  Reason for visit: Follow up scrotal ultrasound results/hydroceles, groin rash, dysuria  HPI: I saw [name] back in urology clinic for the above issues.  He is a 45 year old male who I saw in May 2022 for groin rash and left scrotal swelling.  I personally viewed and interpreted the scrotal ultrasound which shows moderate hydroceles bilaterally but no suspicious testicular masses or other lesions.  He is minimally bothered by the scrotal swelling.  We discussed options for hydroceles including observation or hydrocelectomy as well as the risk benefits.  He is minimally bothered at this time and would like to pursue observation which is very reasonable.  He also had a groin rash at that time that was likely secondary to heat rash from not wearing underwear, and this is since resolved.  He has a long history of urinalyses over the last 3 years with dipstick positive blood but no microscopic RBCs.  He was recently seen in the ED on 08/11/2020 with dysuria.  He felt this was secondary to sexual activity with his wife who had a yeast infection at the time.  He was treated with fluconazole and Pyridium which has resolved his symptoms.  A dipstick urinalysis showed blood, but no microscopic analysis was performed.  He denies any gross hematuria.  We discussed return precautions including recurrence of urinary symptoms or gross hematuria.  If he has recurrence of his dysuria would recommend a urinalysis with microscopic analysis, and consider cystoscopy if persistent symptoms or microscopic blood.  Follow-up as needed  Sondra Come, MD  [facility] [address], [address] [place], [place] [address] [phone]

## 2020-10-10 ENCOUNTER — Ambulatory Visit
Admission: EM | Admit: 2020-10-10 | Discharge: 2020-10-10 | Disposition: A | Discharge status: Home / Self Care | Payer: 59 | Attending: Emergency Medicine | Admitting: Emergency Medicine

## 2020-10-10 DIAGNOSIS — R300 Dysuria: Secondary | ICD-10-CM | POA: Insufficient documentation

## 2020-10-10 LAB — POCT URINALYSIS DIP (DEVICE)
Bilirubin Urine: NEGATIVE
Glucose, UA: NEGATIVE mg/dL
Ketones, ur: NEGATIVE mg/dL
Leukocytes,Ua: NEGATIVE
Nitrite: NEGATIVE
Protein, ur: NEGATIVE mg/dL
Specific Gravity, Urine: >=1.03 (ref 1.005–1.030)
Urobilinogen, UA: 0.2 mg/dL (ref 0.0–1.0)
pH: 6 (ref 5.0–8.0)

## 2020-10-10 MED ORDER — DOXYCYCLINE HYCLATE 100 MG PO CAPS: 100.0000 mg | ORAL_CAPSULE | Freq: Two times a day (BID) | ORAL | 0 refills | Status: AC

## 2020-10-10 MED ORDER — CEFTRIAXONE SODIUM 500 MG IJ SOLR
500.0000 mg | Freq: Once | INTRAMUSCULAR | Status: AC
  Administered 2020-10-10: 500 mg via INTRAMUSCULAR

## 2020-10-10 NOTE — ED Triage Notes (Signed)
Pt c/o dysuria that started today. Pt states that he had a sexual encounter Wednesday and the condom busted, he is concerned about possible STI.

## 2020-10-10 NOTE — Discharge Instructions (Addendum)
Take the doxycycline twice daily with food for 7 days.  Be mindful that the doxycycline will make you more prone to sunburns or sunscreen if you are outdoors for longer than 15 minutes at a time.  If you develop new or worsening urinary symptoms please return for reevaluation.

## 2020-10-10 NOTE — ED Provider Notes (Signed)
[facility]    CSN: [id number] Arrival date & time: 10/10/20  1442      History   Chief Complaint Chief Complaint  Patient presents with   Dysuria    HPI Kevin Harris is a 45 y.o. male.   HPI  45 year old male here for evaluation of painful urination.  Patient reports that he developed burning with urination today along with urinary urgency and frequency.  He is concerned because he recently had a sexual encounter 3 days ago with a woman he met in a bar and the condom broke.  He is unaware if she is having any symptoms because he does not know this woman.  He denies any discharge from his penis and he is declining urethral swab to check for STIs at this time.  Past Medical History:  Diagnosis Date   Diabetes mellitus without complication ([place])    GERD (gastroesophageal reflux disease)     Patient Active Problem List   Diagnosis Date Noted   Acute pancreatitis 03/30/2018    Past Surgical History:  Procedure Laterality Date   WRIST SURGERY Right    severe laceration       Home Medications    Prior to Admission medications   Medication Sig Start Date End Date Taking? Authorizing Provider  atorvastatin (LIPITOR) 40 MG tablet Take 1 tablet by mouth daily. 07/07/20  Yes [provider]  doxycycline (VIBRAMYCIN) 100 MG capsule Take 1 capsule (100 mg total) by mouth 2 (two) times daily for 7 days. 10/10/20 10/17/20 Yes Margarette Canada, NP  fenofibrate 160 MG tablet Take 160 mg by mouth daily. 07/07/20  Yes [provider]  glipiZIDE (GLUCOTROL XL) 10 MG 24 hr tablet Take 10 mg by mouth 2 (two) times daily. 07/08/20  Yes [provider]  hydrOXYzine (ATARAX/VISTARIL) 25 MG tablet Take 25 mg by mouth 3 (three) times daily as needed. 04/08/20  Yes [provider]  metFORMIN (GLUCOPHAGE-XR) 750 MG 24 hr tablet Take 1 tablet by mouth daily with supper. 07/09/20 07/09/21 Yes [provider]  omeprazole (PRILOSEC) 20 MG capsule  Take 1 capsule by mouth daily. 07/07/20  Yes [provider]  valsartan (DIOVAN) 80 MG tablet Take 80 mg by mouth daily. 07/07/20  Yes [provider]  fluticasone (FLONASE) 50 MCG/ACT nasal spray Place 1 spray into both nostrils in the morning and at bedtime for 10 days. 11/06/19 07/14/20  Laurene Footman B, PA-C  sildenafil (VIAGRA) 100 MG tablet Take 100 mg by mouth as needed. 09/20/17 01/18/20  [provider]    Family History Family History  Problem Relation Age of Onset   Healthy Mother    Diabetes Mother    Healthy Father     Social History Social History   Tobacco Use   Smoking status: Every Day    Packs/day: 1.00    Years: 24.00    Pack years: 24.00    Types: Cigarettes   Smokeless tobacco: Never  Vaping Use   Vaping Use: Never used  Substance Use Topics   Alcohol use: Yes    Comment: a pint every week- drinks Tequila   11/06/19 last use 33 days ago   Drug use: Yes    Frequency: 7.0 times per week    Types: Marijuana    Comment: once daily     Allergies   Patient has no known allergies.   Review of Systems Review of Systems  Constitutional:  Negative for activity change, appetite change and  fever.  Gastrointestinal:  Negative for abdominal pain.  Genitourinary:  Positive for dysuria, frequency and urgency. Negative for penile discharge, penile pain and penile swelling.  Musculoskeletal:  Negative for back pain.  Hematological: Negative.   Psychiatric/Behavioral: Negative.      Physical Exam Triage Vital Signs ED Triage Vitals  Enc Vitals Group     BP 10/10/20 1459 (!) 165/96     Pulse Rate 10/10/20 1459 (!) 101     Resp 10/10/20 1459 18     Temp 10/10/20 1459 98.3 F (36.8 C)     Temp Source 10/10/20 1459 Oral     SpO2 10/10/20 1459 100 %     Weight 10/10/20 1457 215 lb (97.5 kg)     Height 10/10/20 1457 $RemoveBefor'5\' 9"'bNqtveVtepoF$  (1.753 m)     Head Circumference --      Peak Flow --      Pain Score 10/10/20 1457 2     Pain Loc --      Pain  Edu? --      Excl. in [place]? --    No data found.  Updated Vital Signs BP (!) 165/96 (BP Location: Left Arm)   Pulse (!) 101   Temp 98.3 F (36.8 C) (Oral)   Resp 18   Ht $R'5\' 9"'kV$  (1.753 m)   Wt 215 lb (97.5 kg)   SpO2 100%   BMI 31.75 kg/m   Visual Acuity Right Eye Distance:   Left Eye Distance:   Bilateral Distance:    Right Eye Near:   Left Eye Near:    Bilateral Near:     Physical Exam Vitals and nursing note reviewed.  Constitutional:      General: He is not in acute distress.    Appearance: Normal appearance. He is not ill-appearing.  HENT:     Head: Normocephalic and atraumatic.  Cardiovascular:     Rate and Rhythm: Normal rate and regular rhythm.     Pulses: Normal pulses.     Heart sounds: Normal heart sounds. No murmur heard.   No gallop.  Pulmonary:     Effort: Pulmonary effort is normal.     Breath sounds: Normal breath sounds. No wheezing, rhonchi or rales.  Abdominal:     Palpations: Abdomen is soft.     Tenderness: There is no abdominal tenderness. There is no right CVA tenderness, left CVA tenderness, guarding or rebound.  Skin:    General: Skin is warm and dry.     Capillary Refill: Capillary refill takes less than 2 seconds.     Findings: No erythema or rash.  Neurological:     General: No focal deficit present.     Mental Status: He is alert and oriented to person, place, and time.  Psychiatric:        Mood and Affect: Mood normal.        Behavior: Behavior normal.        Thought Content: Thought content normal.        Judgment: Judgment normal.     UC Treatments / Results  Labs (all labs ordered are listed, but only abnormal results are displayed) Labs Reviewed  POCT URINALYSIS DIP (DEVICE) - Abnormal; Notable for the following components:      Result Value   Hgb urine dipstick TRACE (*)    All other components within normal limits  POCT URINALYSIS DIPSTICK, ED / UC    EKG   Radiology No results found.  Procedures Procedures  (including critical  care time)  Medications Ordered in UC Medications  cefTRIAXone (ROCEPHIN) injection 500 mg (has no administration in time range)    Initial Impression / Assessment and Plan / UC Course  I have reviewed the triage vital signs and the nursing notes.  Pertinent labs & imaging results that were available during my care of the patient were reviewed by me and considered in my medical decision making (see chart for details).  Patient is a nontoxic-appearing 45 year old male here for evaluation of painful urination that started today that also associate with some urinary urgency and frequency.  He is concerned because of a recent sexual encounter with a woman he met in a bar and he does not know where the condom broke.  He is not having any discharge from his penis and he does not admit to any lesions on his penis.  He denies pain in his testicles.  He is declining STI testing as he does not want swab.  Urinalysis was collected at triage that shows trace blood which is normal for the patient and is otherwise unremarkable.  We will treat patient empirically for possible STI exposure with ceftriaxone 500 mg IM here in clinic and discharged home on a prescription of doxycycline twice daily for 7 days.   Final Clinical Impressions(s) / UC Diagnoses   Final diagnoses:  Dysuria     Discharge Instructions      Take the doxycycline twice daily with food for 7 days.  Be mindful that the doxycycline will make you more prone to sunburns or sunscreen if you are outdoors for longer than 15 minutes at a time.  If you develop new or worsening urinary symptoms please return for reevaluation.     ED Prescriptions     Medication Sig Dispense Auth. Provider   doxycycline (VIBRAMYCIN) 100 MG capsule Take 1 capsule (100 mg total) by mouth 2 (two) times daily for 7 days. 14 capsule Margarette Canada, NP      PDMP not reviewed this encounter.   Margarette Canada, NP 10/10/20 [phone]

## 2020-11-08 ENCOUNTER — Other Ambulatory Visit: Payer: Self-pay

## 2020-11-08 ENCOUNTER — Emergency Department
Admission: EM | Admit: 2020-11-08 | Discharge: 2020-11-08 | Disposition: A | Discharge status: Home / Self Care | Payer: 59 | Attending: Emergency Medicine | Admitting: Emergency Medicine

## 2020-11-08 DIAGNOSIS — K219 Gastro-esophageal reflux disease without esophagitis: Secondary | ICD-10-CM | POA: Insufficient documentation

## 2020-11-08 DIAGNOSIS — K2920 Alcoholic gastritis without bleeding: Secondary | ICD-10-CM | POA: Diagnosis not present

## 2020-11-08 DIAGNOSIS — F17210 Nicotine dependence, cigarettes, uncomplicated: Secondary | ICD-10-CM | POA: Diagnosis not present

## 2020-11-08 DIAGNOSIS — E119 Type 2 diabetes mellitus without complications: Secondary | ICD-10-CM | POA: Insufficient documentation

## 2020-11-08 DIAGNOSIS — Z7984 Long term (current) use of oral hypoglycemic drugs: Secondary | ICD-10-CM | POA: Diagnosis not present

## 2020-11-08 DIAGNOSIS — E860 Dehydration: Secondary | ICD-10-CM | POA: Diagnosis not present

## 2020-11-08 DIAGNOSIS — R1110 Vomiting, unspecified: Secondary | ICD-10-CM | POA: Insufficient documentation

## 2020-11-08 DIAGNOSIS — R109 Unspecified abdominal pain: Secondary | ICD-10-CM | POA: Diagnosis present

## 2020-11-08 LAB — COMPREHENSIVE METABOLIC PANEL
ALT: 20 U/L (ref 0–44)
AST: 19 U/L (ref 15–41)
Albumin: 4.1 g/dL (ref 3.5–5.0)
Alkaline Phosphatase: 34 U/L — ABNORMAL LOW (ref 38–126)
Anion gap: 13 (ref 5–15)
BUN: 15 mg/dL (ref 6–20)
CO2: 23 mmol/L (ref 22–32)
Calcium: 9.3 mg/dL (ref 8.9–10.3)
Chloride: 97 mmol/L — ABNORMAL LOW (ref 98–111)
Creatinine, Ser: 0.75 mg/dL (ref 0.61–1.24)
GFR, Estimated: >60 mL/min (ref >60)
Glucose, Bld: 233 mg/dL — ABNORMAL HIGH (ref 70–99)
Potassium: 5.2 mmol/L — ABNORMAL HIGH (ref 3.5–5.1)
Sodium: 133 mmol/L — ABNORMAL LOW (ref 135–145)
Total Bilirubin: 1.1 mg/dL (ref 0.3–1.2)
Total Protein: 6.9 g/dL (ref 6.5–8.1)

## 2020-11-08 LAB — CBC
HCT: 41.4 % (ref 39.0–52.0)
Hemoglobin: 15 g/dL (ref 13.0–17.0)
MCH: 29.2 pg (ref 26.0–34.0)
MCHC: 36.2 g/dL — ABNORMAL HIGH (ref 30.0–36.0)
MCV: 80.5 fL (ref 80.0–100.0)
Platelets: 360 10*3/uL (ref 150–400)
RBC: 5.14 MIL/uL (ref 4.22–5.81)
RDW: 13.8 % (ref 11.5–15.5)
WBC: 8.4 10*3/uL (ref 4.0–10.5)
nRBC: 0 % (ref 0.0–0.2)

## 2020-11-08 LAB — URINALYSIS, COMPLETE (UACMP) WITH MICROSCOPIC
Bacteria, UA: NONE SEEN
Glucose, UA: >1000 mg/dL — AB
Ketones, ur: 40 mg/dL — AB
Leukocytes,Ua: NEGATIVE
Nitrite: NEGATIVE
Protein, ur: 100 mg/dL — AB
Specific Gravity, Urine: >1.03 — ABNORMAL HIGH (ref 1.005–1.030)
pH: 5.5 (ref 5.0–8.0)

## 2020-11-08 LAB — LIPASE, BLOOD: Lipase: 107 U/L — ABNORMAL HIGH (ref 11–51)

## 2020-11-08 MED ORDER — SODIUM CHLORIDE 0.9 % IV BOLUS
1000.0000 mL | Freq: Once | INTRAVENOUS | Status: AC
  Administered 2020-11-08: 1000 mL via INTRAVENOUS

## 2020-11-08 MED ORDER — PANTOPRAZOLE SODIUM 40 MG IV SOLR
40.0000 mg | Freq: Once | INTRAVENOUS | Status: AC
  Administered 2020-11-08: 40 mg via INTRAVENOUS
  Filled 2020-11-08: qty 40

## 2020-11-08 MED ORDER — FAMOTIDINE 20 MG PO TABS: 20.0000 mg | ORAL_TABLET | Freq: Two times a day (BID) | ORAL | 0 refills | Status: AC

## 2020-11-08 MED ORDER — LACTATED RINGERS IV BOLUS: 1000.0000 mL | Freq: Once | INTRAVENOUS | Status: DC

## 2020-11-08 MED ORDER — ONDANSETRON HCL 4 MG/2ML IJ SOLN
4.0000 mg | Freq: Once | INTRAMUSCULAR | Status: AC
  Administered 2020-11-08: 4 mg via INTRAVENOUS
  Filled 2020-11-08: qty 2

## 2020-11-08 MED ORDER — ALUMINUM-MAGNESIUM-SIMETHICONE 200-200-20 MG/5ML PO SUSP: 30.0000 mL | Freq: Three times a day (TID) | ORAL | 0 refills | Status: AC

## 2020-11-08 MED ORDER — METOCLOPRAMIDE HCL 5 MG/ML IJ SOLN
10.0000 mg | Freq: Once | INTRAMUSCULAR | Status: AC
  Administered 2020-11-08: 10 mg via INTRAVENOUS
  Filled 2020-11-08: qty 2

## 2020-11-08 MED ORDER — ALUM & MAG HYDROXIDE-SIMETH 200-200-20 MG/5ML PO SUSP
30.0000 mL | Freq: Once | ORAL | Status: AC
  Administered 2020-11-08: 30 mL via ORAL
  Filled 2020-11-08: qty 30

## 2020-11-08 MED ORDER — KETOROLAC TROMETHAMINE 30 MG/ML IJ SOLN
15.0000 mg | INTRAMUSCULAR | Status: AC
  Administered 2020-11-08: 15 mg via INTRAVENOUS
  Filled 2020-11-08: qty 1

## 2020-11-08 MED ORDER — METOCLOPRAMIDE HCL 10 MG PO TABS: 10.0000 mg | ORAL_TABLET | Freq: Four times a day (QID) | ORAL | 0 refills | Status: AC | PRN

## 2020-11-08 NOTE — ED Notes (Signed)
Pt passed PO challenge and verbalized feeling safe being discharge. Pt ambulatory and in NAD.

## 2020-11-08 NOTE — ED Provider Notes (Addendum)
[facility] Emergency Department Provider Note  ____________________________________________  Time seen: Approximately 10:09 AM  I have reviewed the triage vital signs and the nursing notes.   HISTORY  Chief Complaint Abdominal Pain    HPI Kevin Harris is a 45 y.o. male with a history of diabetes and GERD who comes ED complaining of generalized abdominal pain and vomiting that started yesterday morning.  Patient reports that the previous evening he had drank 1/5 of tequila, the next morning after eating he started to feel stomach upset.  Since then, he has not been able to eat or drink anything without vomiting.  He is still vomiting.  Denies black or bloody stool or hematemesis.  Emesis is watery.  No body aches fevers chills or other acute complaints.  No dizziness or syncope.  Symptoms are constant, waxing waning, no alleviating factors.  Symptoms aggravated by p.o. intake    Past Medical History:  Diagnosis Date   Diabetes mellitus without complication ([facility])    GERD (gastroesophageal reflux disease)      Patient Active Problem List   Diagnosis Date Noted   Acute pancreatitis 03/30/2018     Past Surgical History:  Procedure Laterality Date   WRIST SURGERY Right    severe laceration     Prior to Admission medications   Medication Sig Start Date End Date Taking? Authorizing Provider  aluminum-magnesium hydroxide-simethicone (MAALOX) 200-200-20 MG/5ML SUSP Take 30 mLs by mouth 4 (four) times daily -  before meals and at bedtime. 11/08/20  Yes Sharman Cheek, MD  famotidine (PEPCID) 20 MG tablet Take 1 tablet (20 mg total) by mouth 2 (two) times daily. 11/08/20  Yes Sharman Cheek, MD  metoCLOPramide (REGLAN) 10 MG tablet Take 1 tablet (10 mg total) by mouth every 6 (six) hours as needed. 11/08/20  Yes Sharman Cheek, MD  sildenafil (VIAGRA) 100 MG tablet Take 1 tablet by mouth daily as needed for erectile dysfunction. 10/12/20  Yes  [provider]  atorvastatin (LIPITOR) 40 MG tablet Take 1 tablet by mouth daily. 07/07/20   [provider]  fenofibrate 160 MG tablet Take 160 mg by mouth daily. 07/07/20   [provider]  glipiZIDE (GLUCOTROL XL) 10 MG 24 hr tablet Take 10 mg by mouth 2 (two) times daily. 07/08/20   [provider]  hydrOXYzine (ATARAX/VISTARIL) 25 MG tablet Take 25 mg by mouth 3 (three) times daily as needed. 04/08/20   [provider]  metFORMIN (GLUCOPHAGE-XR) 750 MG 24 hr tablet Take 1 tablet by mouth daily with supper. 07/09/20 07/09/21  [provider]  omeprazole (PRILOSEC) 20 MG capsule Take 1 capsule by mouth daily. 07/07/20   [provider]  valsartan (DIOVAN) 80 MG tablet Take 80 mg by mouth daily. 07/07/20   [provider]  fluticasone (FLONASE) 50 MCG/ACT nasal spray Place 1 spray into both nostrils in the morning and at bedtime for 10 days. 11/06/19 07/14/20  Shirlee Latch, PA-C     Allergies Patient has no known allergies.   Family History  Problem Relation Age of Onset   Healthy Mother    Diabetes Mother    Healthy Father     Social History Social History   Tobacco Use   Smoking status: Every Day    Packs/day: 1.00    Years: 24.00    Pack years: 24.00    Types: Cigarettes   Smokeless tobacco: Never  Vaping Use   Vaping Use: Never used  Substance Use Topics  Alcohol use: Yes    Comment: a pint every week- drinks Tequila   11/06/19 last use 33 days ago   Drug use: Yes    Frequency: 7.0 times per week    Types: Marijuana    Comment: once daily    Review of Systems  Constitutional:   No fever or chills.  ENT:   No sore throat. No rhinorrhea. Cardiovascular:   No chest pain or syncope. Respiratory:   No dyspnea or cough. Gastrointestinal:   Positive for generalized abdominal pain and vomiting.  Musculoskeletal:   Negative for focal pain or swelling All other systems reviewed and are negative except as  documented above in ROS and HPI.  ____________________________________________   PHYSICAL EXAM:  VITAL SIGNS: ED Triage Vitals  Enc Vitals Group     BP 11/08/20 1002 (!) 168/94     Pulse Rate 11/08/20 1002 98     Resp 11/08/20 1002 18     Temp 11/08/20 1002 98 F (36.7 C)     Temp src --      SpO2 11/08/20 1002 100 %     Weight --      Height --      Head Circumference --      Peak Flow --      Pain Score 11/08/20 1001 10     Pain Loc --      Pain Edu? --      Excl. in [place]? --     Vital signs reviewed, nursing assessments reviewed.   Constitutional:   Alert and oriented. Non-toxic appearance. Eyes:   Conjunctivae are normal. EOMI. PERRL. ENT      Head:   Normocephalic and atraumatic.      Nose:   Wearing a mask.      Mouth/Throat:   Wearing a mask.      Neck:   No meningismus. Full ROM. Hematological/Lymphatic/Immunilogical:   No cervical lymphadenopathy. Cardiovascular:   RRR. Symmetric bilateral radial and DP pulses.  No murmurs. Cap refill less than 2 seconds. Respiratory:   Normal respiratory effort without tachypnea/retractions. Breath sounds are clear and equal bilaterally. No wheezes/rales/rhonchi. Gastrointestinal:   Soft and nontender. Non distended. There is no CVA tenderness.  No rebound, rigidity, or guarding. Genitourinary:   deferred Musculoskeletal:   Normal range of motion in all extremities. No joint effusions.  No lower extremity tenderness.  No edema. Neurologic:   Normal speech and language.  Motor grossly intact. No acute focal neurologic deficits are appreciated.  Skin:    Skin is warm, dry and intact. No rash noted.  No petechiae, purpura, or bullae.  ____________________________________________    LABS (pertinent positives/negatives) (all labs ordered are listed, but only abnormal results are displayed) Labs Reviewed  CBC - Abnormal; Notable for the following components:      Result Value   MCHC 36.2 (*)    All other components within  normal limits  URINALYSIS, COMPLETE (UACMP) WITH MICROSCOPIC - Abnormal; Notable for the following components:   Specific Gravity, Urine >1.030 (*)    Glucose, UA >1,000 (*)    Hgb urine dipstick SMALL (*)    Bilirubin Urine SMALL (*)    Ketones, ur 40 (*)    Protein, ur 100 (*)    All other components within normal limits  COMPREHENSIVE METABOLIC PANEL - Abnormal; Notable for the following components:   Sodium 133 (*)    Potassium 5.2 (*)    Chloride 97 (*)    Glucose, Bld  233 (*)    Alkaline Phosphatase 34 (*)    All other components within normal limits  LIPASE, BLOOD - Abnormal; Notable for the following components:   Lipase 107 (*)    All other components within normal limits   ____________________________________________   EKG    ____________________________________________    RADIOLOGY  No results found.  ____________________________________________   PROCEDURES Procedures  ____________________________________________  DIFFERENTIAL DIAGNOSIS   Alcoholic gastritis, pancreatitis, electrolyte abnormality, dehydration  CLINICAL IMPRESSION / ASSESSMENT AND PLAN / ED COURSE  Medications ordered in the ED: Medications  sodium chloride 0.9 % bolus 1,000 mL (0 mLs Intravenous Stopped 11/08/20 1150)  ondansetron (ZOFRAN) injection 4 mg (4 mg Intravenous Given 11/08/20 1032)  pantoprazole (PROTONIX) injection 40 mg (40 mg Intravenous Given 11/08/20 1033)  ketorolac (TORADOL) 30 MG/ML injection 15 mg (15 mg Intravenous Given 11/08/20 1246)  alum & mag hydroxide-simeth (MAALOX/MYLANTA) 200-200-20 MG/5ML suspension 30 mL (30 mLs Oral Given 11/08/20 1250)  metoCLOPramide (REGLAN) injection 10 mg (10 mg Intravenous Given 11/08/20 1246)    Pertinent labs & imaging results that were available during my care of the patient were reviewed by me and considered in my medical decision making (see chart for details).  Kevin Harris was evaluated in Emergency Department on  11/08/2020 for the symptoms described in the history of present illness. He was evaluated in the context of the global COVID-19 pandemic, which necessitated consideration that the patient might be at risk for infection with the SARS-CoV-2 virus that causes COVID-19. Institutional protocols and algorithms that pertain to the evaluation of patients at risk for COVID-19 are in a state of rapid change based on information released by regulatory bodies including the CDC and federal and state organizations. These policies and algorithms were followed during the patient's care in the ED.   Patient presents with abdominal pain and vomiting after heavy alcohol intake.  Most likely gastritis or pancreatitis.  Doubt biliary disease appendicitis bowel obstruction GI bleed or perforation.  No evidence of dissection AAA or mesenteric ischemia.  Will give IV fluids Zofran and Protonix.  Check labs.  Clinical Course as of 11/08/20 1426  Sun Nov 08, 2020  1423 Patient feels much better after Maalox and further antacids.  Counseled on avoiding alcohol intake, he reports motivation to do so.  Discussed with spouse at bedside as well.  Apart from mild alcoholic gastritis and pancreatitis, work-up is reassuring he is nontoxic and stable for discharge [PS]    Clinical Course User Index [PS] Sharman Cheek, MD     ____________________________________________   FINAL CLINICAL IMPRESSION(S) / ED DIAGNOSES    Final diagnoses:  Acute alcoholic gastritis without hemorrhage  Dehydration     ED Discharge Orders          Ordered    aluminum-magnesium hydroxide-simethicone (MAALOX) 200-200-20 MG/5ML SUSP  3 times daily before meals & bedtime        11/08/20 1424    famotidine (PEPCID) 20 MG tablet  2 times daily        11/08/20 1424    metoCLOPramide (REGLAN) 10 MG tablet  Every 6 hours PRN        11/08/20 1424            Portions of this note were generated with dragon dictation software. Dictation  errors may occur despite best attempts at proofreading.    Sharman Cheek, MD 11/08/20 1011    Sharman Cheek, MD 11/08/20 1426

## 2020-11-08 NOTE — ED Notes (Signed)
Family at nurses station at this time. Asking about visitor restrictions. Explained pt is able to have 1 visitor but they would be unable to switch out. Family member at bedside verbalized understanding.

## 2020-11-08 NOTE — ED Notes (Signed)
Pt called out reporting he continues to have pain and nausea with hot and cold spells and feels "horrible." MD made aware.

## 2020-11-08 NOTE — ED Triage Notes (Signed)
Pt comes with c/o abdominal pain since yesterday. Pt states N/V/D

## 2021-09-20 ENCOUNTER — Ambulatory Visit
Admission: EM | Admit: 2021-09-20 | Discharge: 2021-09-20 | Disposition: A | Discharge status: Home / Self Care | Payer: 59 | Attending: Emergency Medicine | Admitting: Emergency Medicine

## 2021-09-20 DIAGNOSIS — B084 Enteroviral vesicular stomatitis with exanthem: Secondary | ICD-10-CM

## 2021-09-20 MED ORDER — LIDOCAINE VISCOUS HCL 2 % MT SOLN: 5.0000 mL | Freq: Four times a day (QID) | OROMUCOSAL | 0 refills | Status: AC | PRN

## 2021-09-20 NOTE — Discharge Instructions (Signed)
Use over-the-counter Tylenol and ibuprofen according to the package instructions as needed for pain and fever.  Drink cool fluids as this may help you with discomfort in your mouth and also help you maintain hydration.  You can use Sucrets or Chloraseptic lozenges but no more than 1 lozenge every 2 hours as the menthol may cause diarrhea.  Use the magic mouthwash 15 minutes before meals and at bedtime as needed for ST and mouth pain.  Return for reevaluation, or see your pediatrician, for new or worsening symptoms.

## 2021-09-20 NOTE — ED Triage Notes (Signed)
Patient reports that Saturday night with needles in his feet and hands with blisters.  Sunday he reports was worse and then he noticed a blister on his nose. He reports he popped it -- and liquid come out of the one that was on his nose.

## 2021-09-20 NOTE — ED Provider Notes (Signed)
[facility]    CSN: [id number] Arrival date & time: 09/20/21  1529      History   Chief Complaint Chief Complaint  Patient presents with   Rash    Hands, Feet, and nose.      HPI Kevin Harris is a 46 y.o. male.   HPI  46 year old male here for evaluation of skin rash.  Patient reports that symptoms began 3 days ago with a sore throat and fever.  The following day he noticed a red rash develop on his palms and the soles of his feet.  He states he feels like he has needles in both.  He also has lesions on the roof of his mouth and in his nose.  Past Medical History:  Diagnosis Date   Diabetes mellitus without complication (HCC)    GERD (gastroesophageal reflux disease)     Patient Active Problem List   Diagnosis Date Noted   Acute pancreatitis 03/30/2018    Past Surgical History:  Procedure Laterality Date   WRIST SURGERY Right    severe laceration       Home Medications    Prior to Admission medications   Medication Sig Start Date End Date Taking? Authorizing Provider  glipiZIDE (GLUCOTROL XL) 10 MG 24 hr tablet Take 10 mg by mouth 2 (two) times daily. 07/08/20  Yes [provider]  magic mouthwash (lidocaine, diphenhydrAMINE, alum & mag hydroxide) suspension Swish and spit 5 mLs 4 (four) times daily as needed for mouth pain. 09/20/21  Yes Becky Augusta, NP  metFORMIN (GLUCOPHAGE-XR) 750 MG 24 hr tablet Take 1 tablet by mouth daily with supper. 07/09/20  Yes [provider]  omeprazole (PRILOSEC) 20 MG capsule Take 1 capsule by mouth daily. 07/07/20  Yes [provider]  aluminum-magnesium hydroxide-simethicone (MAALOX) 200-200-20 MG/5ML SUSP Take 30 mLs by mouth 4 (four) times daily -  before meals and at bedtime. 11/08/20   Sharman Cheek, MD  atorvastatin (LIPITOR) 40 MG tablet Take 1 tablet by mouth daily. 07/07/20   [provider]  famotidine (PEPCID) 20 MG tablet Take 1 tablet (20 mg total) by mouth 2 (two)  times daily. 11/08/20   Sharman Cheek, MD  fenofibrate 160 MG tablet Take 160 mg by mouth daily. 07/07/20   [provider]  hydrOXYzine (ATARAX/VISTARIL) 25 MG tablet Take 25 mg by mouth 3 (three) times daily as needed. 04/08/20   [provider]  metoCLOPramide (REGLAN) 10 MG tablet Take 1 tablet (10 mg total) by mouth every 6 (six) hours as needed. 11/08/20   Sharman Cheek, MD  sildenafil (VIAGRA) 100 MG tablet Take 1 tablet by mouth daily as needed for erectile dysfunction. 10/12/20   [provider]  valsartan (DIOVAN) 80 MG tablet Take 80 mg by mouth daily. 07/07/20   [provider]  fluticasone (FLONASE) 50 MCG/ACT nasal spray Place 1 spray into both nostrils in the morning and at bedtime for 10 days. 11/06/19 07/14/20  Shirlee Latch, PA-C    Family History Family History  Problem Relation Age of Onset   Healthy Mother    Diabetes Mother    Healthy Father     Social History Social History   Tobacco Use   Smoking status: Every Day    Packs/day: 1.00    Years: 24.00    Total pack years: 24.00    Types: Cigarettes   Smokeless tobacco: Never  Vaping Use   Vaping Use: Never used  Substance Use Topics  Alcohol use: Yes    Comment: a pint every week- drinks Tequila   11/06/19 last use 33 days ago   Drug use: Yes    Frequency: 7.0 times per week    Types: Marijuana    Comment: once daily     Allergies   Patient has no known allergies.   Review of Systems Review of Systems  Constitutional:  Positive for fever.  HENT:  Positive for sore throat. Negative for congestion and rhinorrhea.   Respiratory:  Negative for cough.   Skin:  Positive for rash.     Physical Exam Triage Vital Signs ED Triage Vitals  Enc Vitals Group     BP 09/20/21 1643 (S) (!) 167/106     Pulse Rate 09/20/21 1643 (!) 111     Resp --      Temp 09/20/21 1643 98.2 F (36.8 C)     Temp src --      SpO2 09/20/21 1643 98 %     Weight 09/20/21 1641 220 lb  (99.8 kg)     Height 09/20/21 1641 5\' 9"  (1.753 m)     Head Circumference --      Peak Flow --      Pain Score 09/20/21 1641 7     Pain Loc --      Pain Edu? --      Excl. in [place]? --    No data found.  Updated Vital Signs BP (S) (!) 167/106 (BP Location: Left Arm)   Pulse (!) 111   Temp 98.2 F (36.8 C)   Ht 5\' 9"  (1.753 m)   Wt 220 lb (99.8 kg)   SpO2 98%   BMI 32.49 kg/m   Visual Acuity Right Eye Distance:   Left Eye Distance:   Bilateral Distance:    Right Eye Near:   Left Eye Near:    Bilateral Near:     Physical Exam Vitals and nursing note reviewed.  Constitutional:      Appearance: Normal appearance. He is not ill-appearing.  HENT:     Mouth/Throat:     Mouth: Mucous membranes are moist.     Pharynx: Oropharynx is clear. Posterior oropharyngeal erythema present. No oropharyngeal exudate.  Skin:    Capillary Refill: Capillary refill takes less than 2 seconds.     Findings: Rash present.  Neurological:     General: No focal deficit present.     Mental Status: He is alert and oriented to person, place, and time.  Psychiatric:        Mood and Affect: Mood normal.        Behavior: Behavior normal.        Thought Content: Thought content normal.        Judgment: Judgment normal.      UC Treatments / Results  Labs (all labs ordered are listed, but only abnormal results are displayed) Labs Reviewed - No data to display  EKG   Radiology No results found.  Procedures Procedures (including critical care time)  Medications Ordered in UC Medications - No data to display  Initial Impression / Assessment and Plan / UC Course  I have reviewed the triage vital signs and the nursing notes.  Pertinent labs & imaging results that were available during my care of the patient were reviewed by me and considered in my medical decision making (see chart for details).  Patient is a very pleasant, nontoxic-appearing 46 year old male here for evaluation of rash  on the palms  of both hands and the soles of both feet.  This developed 2 days ago.  It was preceded by fever and sore throat.  Patient's physical exam reveals erythematous macular lesions on the hard palate.  His posterior oropharynx is also mildly erythematous.  No cervical lymphadenopathy appreciable exam.  Patient has erythematous and macular lesions on the palms of his hands and soles of both feet.  Patient exam is consistent with hand-foot-and-mouth disease.  I advised patient that this is a viral illness that is spread through respiratory droplet and he should wear a mask around other people and isolate for the first week of his infectious symptoms.  Tylenol and ibuprofen as needed for pain or fever, Chloraseptic or Sucrets lozenges as needed for sore throat pain, and I will prescribe Magic mouthwash that he can use before meals and at bedtime as needed.  Return precautions reviewed.   Final Clinical Impressions(s) / UC Diagnoses   Final diagnoses:  Hand, foot and mouth disease (HFMD)     Discharge Instructions      Use over-the-counter Tylenol and ibuprofen according to the package instructions as needed for pain and fever.  Drink cool fluids as this may help you with discomfort in your mouth and also help you maintain hydration.  You can use Sucrets or Chloraseptic lozenges but no more than 1 lozenge every 2 hours as the menthol may cause diarrhea.  Use the magic mouthwash 15 minutes before meals and at bedtime as needed for ST and mouth pain.  Return for reevaluation, or see your pediatrician, for new or worsening symptoms.      ED Prescriptions     Medication Sig Dispense Auth. Provider   magic mouthwash (lidocaine, diphenhydrAMINE, alum & mag hydroxide) suspension Swish and spit 5 mLs 4 (four) times daily as needed for mouth pain. 360 mL Becky Augusta, NP      PDMP not reviewed this encounter.   Becky Augusta, NP 09/20/21 1724

## 2022-07-31 ENCOUNTER — Other Ambulatory Visit: Payer: Self-pay

## 2022-07-31 ENCOUNTER — Encounter: Payer: Self-pay | Admitting: Emergency Medicine

## 2022-07-31 ENCOUNTER — Emergency Department
Admission: EM | Admit: 2022-07-31 | Discharge: 2022-07-31 | Disposition: A | Discharge status: Home / Self Care | Payer: 59 | Attending: Emergency Medicine | Admitting: Emergency Medicine

## 2022-07-31 DIAGNOSIS — I10 Essential (primary) hypertension: Secondary | ICD-10-CM | POA: Diagnosis not present

## 2022-07-31 DIAGNOSIS — R1084 Generalized abdominal pain: Secondary | ICD-10-CM | POA: Insufficient documentation

## 2022-07-31 DIAGNOSIS — K5900 Constipation, unspecified: Secondary | ICD-10-CM | POA: Insufficient documentation

## 2022-07-31 DIAGNOSIS — R109 Unspecified abdominal pain: Secondary | ICD-10-CM | POA: Diagnosis present

## 2022-07-31 DIAGNOSIS — E119 Type 2 diabetes mellitus without complications: Secondary | ICD-10-CM | POA: Insufficient documentation

## 2022-07-31 DIAGNOSIS — R112 Nausea with vomiting, unspecified: Secondary | ICD-10-CM | POA: Insufficient documentation

## 2022-07-31 LAB — CBC
HCT: 43.4 % (ref 39.0–52.0)
Hemoglobin: 14.8 g/dL (ref 13.0–17.0)
MCH: 28.2 pg (ref 26.0–34.0)
MCHC: 34.1 g/dL (ref 30.0–36.0)
MCV: 82.8 fL (ref 80.0–100.0)
Platelets: 306 10*3/uL (ref 150–400)
RBC: 5.24 MIL/uL (ref 4.22–5.81)
RDW: 13.8 % (ref 11.5–15.5)
WBC: 8.5 10*3/uL (ref 4.0–10.5)
nRBC: 0 % (ref 0.0–0.2)

## 2022-07-31 LAB — COMPREHENSIVE METABOLIC PANEL
ALT: 21 U/L (ref 0–44)
AST: 15 U/L (ref 15–41)
Albumin: 4.5 g/dL (ref 3.5–5.0)
Alkaline Phosphatase: 47 U/L (ref 38–126)
Anion gap: 9 (ref 5–15)
BUN: 9 mg/dL (ref 6–20)
CO2: 25 mmol/L (ref 22–32)
Calcium: 9.4 mg/dL (ref 8.9–10.3)
Chloride: 96 mmol/L — ABNORMAL LOW (ref 98–111)
Creatinine, Ser: 0.68 mg/dL (ref 0.61–1.24)
GFR, Estimated: >60 mL/min (ref >60)
Glucose, Bld: 254 mg/dL — ABNORMAL HIGH (ref 70–99)
Potassium: 4 mmol/L (ref 3.5–5.1)
Sodium: 130 mmol/L — ABNORMAL LOW (ref 135–145)
Total Bilirubin: 1.2 mg/dL (ref 0.3–1.2)
Total Protein: 7.8 g/dL (ref 6.5–8.1)

## 2022-07-31 LAB — LIPASE, BLOOD: Lipase: 37 U/L (ref 11–51)

## 2022-07-31 MED ORDER — ONDANSETRON HCL 4 MG/2ML IJ SOLN
4.0000 mg | Freq: Once | INTRAMUSCULAR | Status: AC
  Administered 2022-07-31: 4 mg via INTRAVENOUS
  Filled 2022-07-31: qty 2

## 2022-07-31 MED ORDER — SODIUM CHLORIDE 0.9 % IV BOLUS
500.0000 mL | Freq: Once | INTRAVENOUS | Status: AC
  Administered 2022-07-31: 500 mL via INTRAVENOUS

## 2022-07-31 MED ORDER — KETOROLAC TROMETHAMINE 15 MG/ML IJ SOLN
15.0000 mg | Freq: Once | INTRAMUSCULAR | Status: AC
  Administered 2022-07-31: 15 mg via INTRAVENOUS
  Filled 2022-07-31: qty 1

## 2022-07-31 MED ORDER — ONDANSETRON 4 MG PO TBDP: 4.0000 mg | ORAL_TABLET | Freq: Three times a day (TID) | ORAL | 0 refills | Status: AC | PRN

## 2022-07-31 NOTE — ED Triage Notes (Signed)
Pt via POV from home. Pt c/o R sided abd pain, nausea since last night. Pt has a hx of pancreatitis and drank on Friday. Denies constipation/diarrhea. Pt is A&Ox4 and NAD

## 2022-07-31 NOTE — Discharge Instructions (Addendum)
You were seen in the emergency department for abdominal pain associated with nausea and constipation.  It is importantly stay hydrated and drink plenty of fluids.  Your glucose level was elevated in the emergency department, it is importantly check your glucose frequently, watch what you eat and follow-up closely with your primary care physician as you may need to change your diabetic medications.  Work on decreasing your alcohol use, you were given information to get help with your alcohol intake.  Stay hydrated and drink plenty of fluids.  You are given a prescription for nausea medication.  Return to the emergency department for any worsening or ongoing symptoms.  Follow-up closely with your primary care physician.

## 2022-07-31 NOTE — ED Provider Notes (Addendum)
[facility] Provider Note    Event Date/Time   First MD Initiated Contact with Patient 07/31/22 1047     (approximate)   History   Abdominal Pain   HPI  Kevin Harris is a 47 y.o. male past medical history of hypertension, hyperlipidemia, diabetes, alcohol abuse, who presents to the emergency department with abdominal pain.  Endorses right-sided abdominal pain that has been intermittent and cramping for the past couple of days.  Associate with nausea and vomiting.  Last bowel movement was a couple of days ago.  States that his pain was worse when trying to have a bowel movement.  Denies any blood in his stool.  Last drink of alcohol was on Friday.  Endorses a significant mount of alcohol use.  No prior abdominal surgeries.  Passing flatus.  Feels dehydrated.  States that he is past due for primary care physician appointment.  No abdominal pain at this time.     Physical Exam   Triage Vital Signs: ED Triage Vitals  Enc Vitals Group     BP 07/31/22 0911 (!) 177/113     Pulse Rate 07/31/22 0911 (!) 105     Resp 07/31/22 0911 20     Temp 07/31/22 0911 97.7 F (36.5 C)     Temp Source 07/31/22 0911 Oral     SpO2 07/31/22 0911 97 %     Weight 07/31/22 0909 220 lb (99.8 kg)     Height 07/31/22 0909 5\' 9"  (1.753 m)     Head Circumference --      Peak Flow --      Pain Score 07/31/22 0909 8     Pain Loc --      Pain Edu? --      Excl. in [place]? --     Most recent vital signs: Vitals:   07/31/22 0911  BP: (!) 177/113  Pulse: (!) 105  Resp: 20  Temp: 97.7 F (36.5 C)  SpO2: 97%    Physical Exam Constitutional:      Appearance: He is well-developed.  HENT:     Head: Atraumatic.  Eyes:     Conjunctiva/sclera: Conjunctivae normal.  Cardiovascular:     Rate and Rhythm: Regular rhythm.  Pulmonary:     Effort: No respiratory distress.  Abdominal:     Tenderness: There is no abdominal tenderness. There is no right CVA tenderness or left CVA  tenderness. Negative signs include Murphy's sign and McBurney's sign.  Musculoskeletal:     Cervical back: Normal range of motion.  Skin:    General: Skin is warm.     Capillary Refill: Capillary refill takes less than 2 seconds.  Neurological:     Mental Status: He is alert. Mental status is at baseline.     IMPRESSION / MDM / ASSESSMENT AND PLAN / ED COURSE  I reviewed the triage vital signs and the nursing notes.  Differential diagnosis including pancreatitis, gastritis/PUD, constipation, gastroparesis.  No abdominal tenderness to palpation at this time have a low suspicion for acute appendicitis.  Negative Murphy sign have a low suspicion for acute cholecystitis.  Passing flatus with no prior abdominal surgeries, have low suspicion for small bowel obstruction.  Most likely with constipation.  On arrival tachycardic.  LABS (all labs ordered are listed, but only abnormal results are displayed) Labs interpreted as -    Labs Reviewed  COMPREHENSIVE METABOLIC PANEL - Abnormal; Notable for the following components:      Result Value  Sodium 130 (*)    Chloride 96 (*)    Glucose, Bld 254 (*)    All other components within normal limits  LIPASE, BLOOD  CBC  URINALYSIS, ROUTINE W REFLEX MICROSCOPIC     MDM  Hyperglycemia but does not meet criteria for DKA.  No significant electrolyte abnormalities outside of mild hyponatremia.  Normal CO2.  No leukocytosis or anemia.  Lipase within normal limits.  Patient does not have any signs or symptoms concerning for pyelonephritis.  No abdominal tenderness to palpation at this time and no signs of rebound or guarding.  Have low suspicion for acute appendicitis, acute cholecystitis, small bowel obstruction or other acute intra-abdominal pathology.  Given IV ketorolac, IV fluids and Zofran  Discussed close follow-up with his primary care physician this week for reevaluation.  Encouraged on frequent glucose checks and decreasing alcohol use.   Given return precautions for any ongoing symptoms.  Given a prescription for Zofran.  Discussed symptomatic treatment with MiraLAX.     PROCEDURES:  Critical Care performed: No  Procedures  Patient's presentation is most consistent with acute presentation with potential threat to life or bodily function.   MEDICATIONS ORDERED IN ED: Medications  ondansetron (ZOFRAN) injection 4 mg (4 mg Intravenous Given 07/31/22 1126)  sodium chloride 0.9 % bolus 500 mL (500 mLs Intravenous New Bag/Given 07/31/22 1126)  ketorolac (TORADOL) 15 MG/ML injection 15 mg (15 mg Intravenous Given 07/31/22 1126)    FINAL CLINICAL IMPRESSION(S) / ED DIAGNOSES   Final diagnoses:  Generalized abdominal pain  Nausea and vomiting, unspecified vomiting type  Constipation, unspecified constipation type     Rx / DC Orders   ED Discharge Orders          Ordered    ondansetron (ZOFRAN-ODT) 4 MG disintegrating tablet  Every 8 hours PRN        07/31/22 1108             Note:  This document was prepared using Dragon voice recognition software and may include unintentional dictation errors.   Corena Herter, MD 07/31/22 1115    Corena Herter, MD 07/31/22 1204

## 2022-07-31 NOTE — ED Notes (Signed)
See triage note  Presents with right sided abd pain  States pain started last pm  Pain is in right side of abd and back Pos nausea  No fever  States pain was worse yesterday

## 2022-11-16 IMAGING — US US SCROTUM W/ DOPPLER COMPLETE
1 series · 13 of 25 positions shown · non-contrast
Comparison: None

CLINICAL DATA: Scrotal swelling on LEFT for several months
increased recently

EXAM:
SCROTAL ULTRASOUND
DOPPLER ULTRASOUND OF THE TESTICLES
TECHNIQUE: Complete ultrasound examination of the testicles, epididymis, and
other scrotal structures was performed. Color and spectral Doppler
ultrasound were also utilized to evaluate blood flow to the
testicles.

[Series 1: us scrotum w/doppler · 65 acquisitions, 13 frames shown]
[im 1/65]
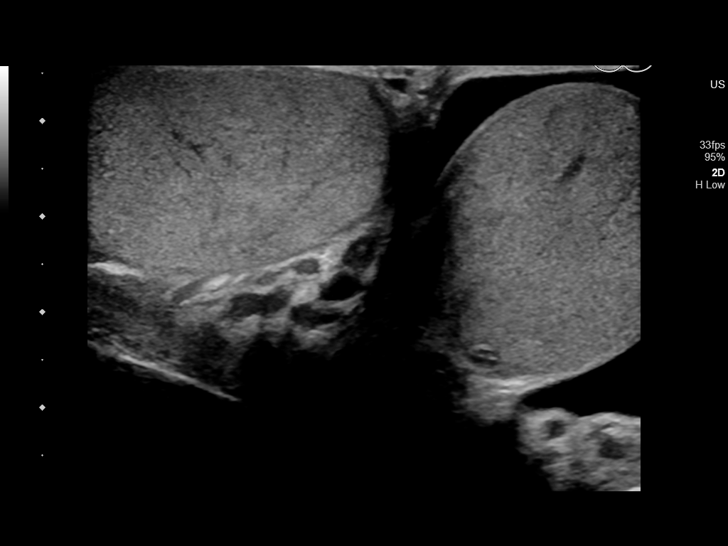
[im 6/65]
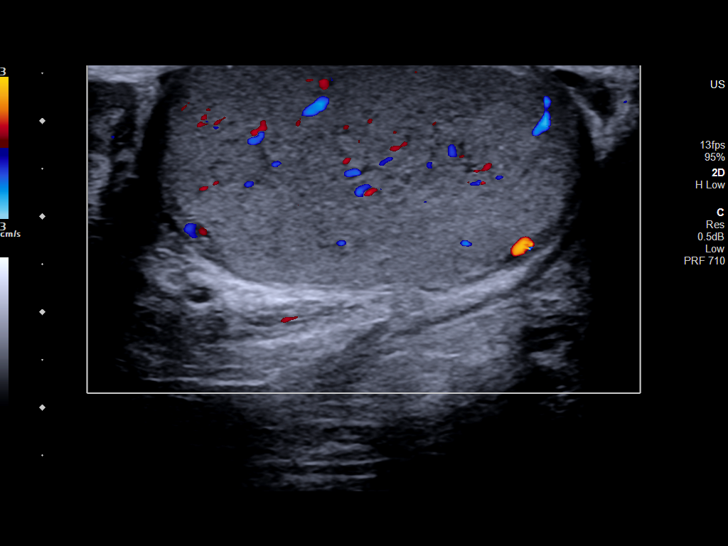
[im 11/65]
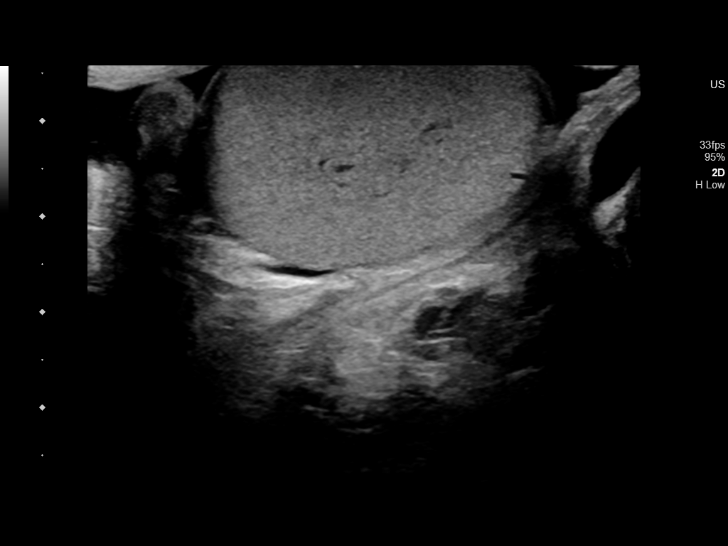
[im 17/65]
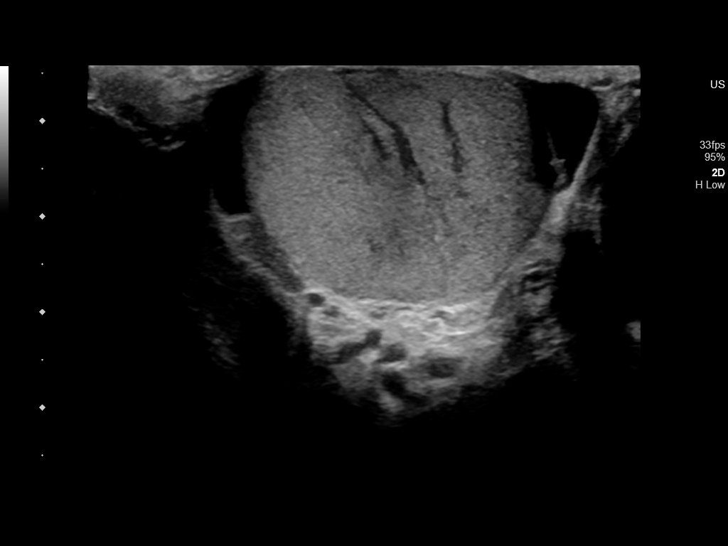
[im 22/65]
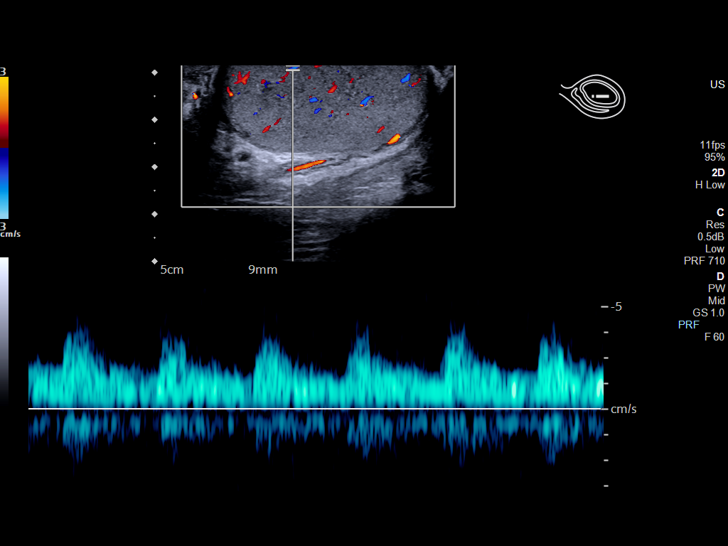
[im 27/65]
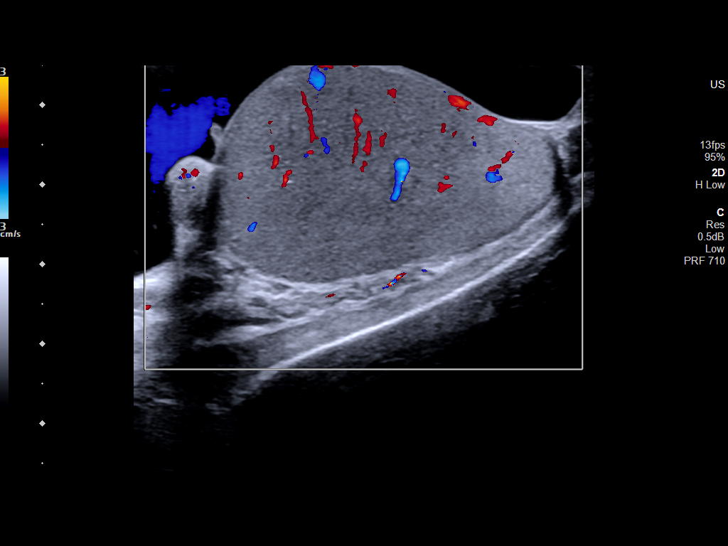
[im 33/65]
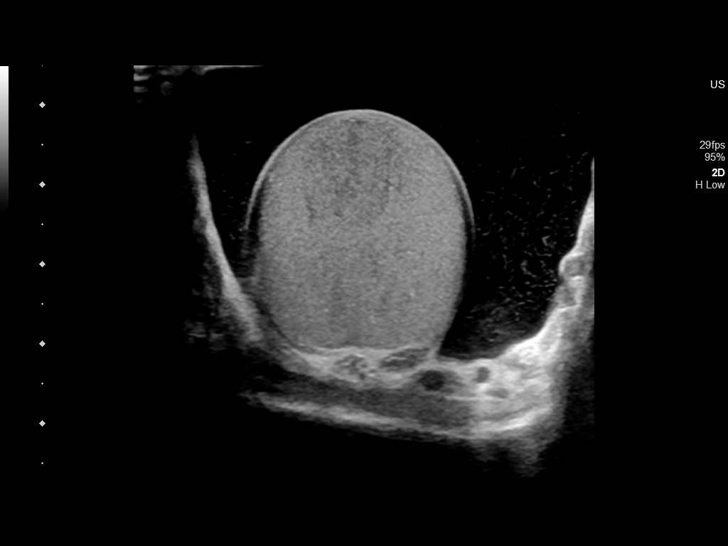
[im 38/65]
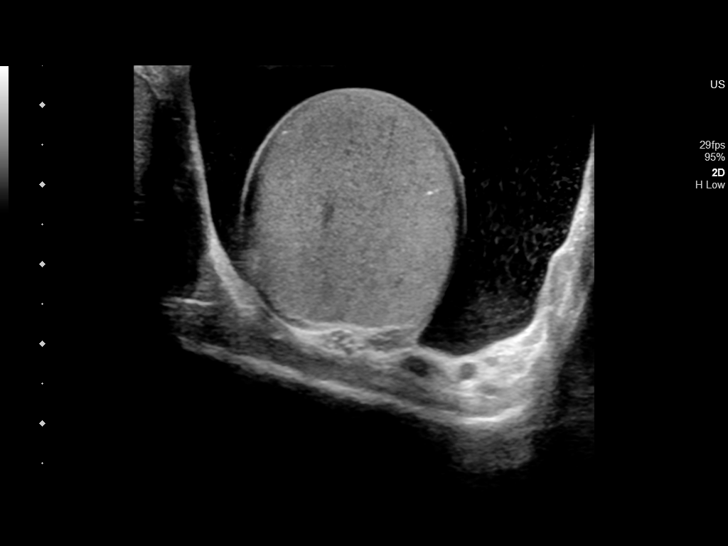
[im 43/65]
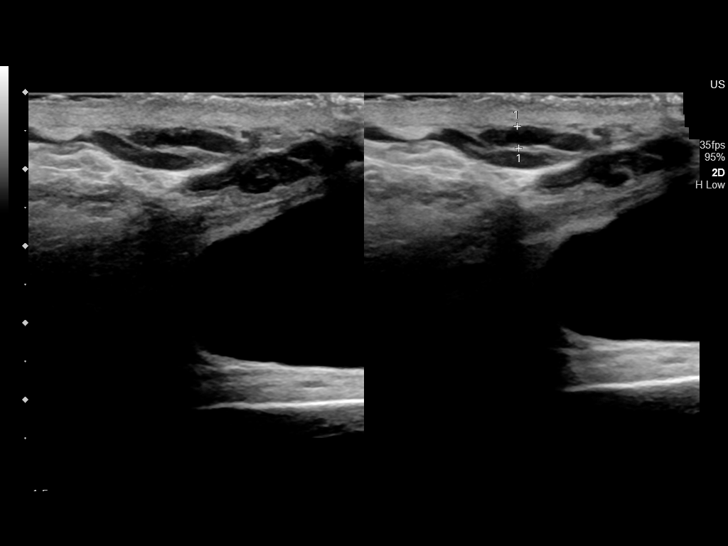
[im 49/65]
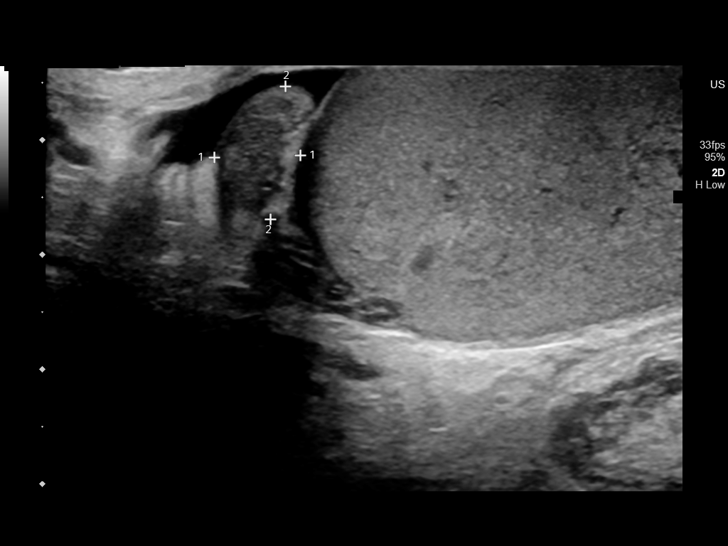
[im 54/65]
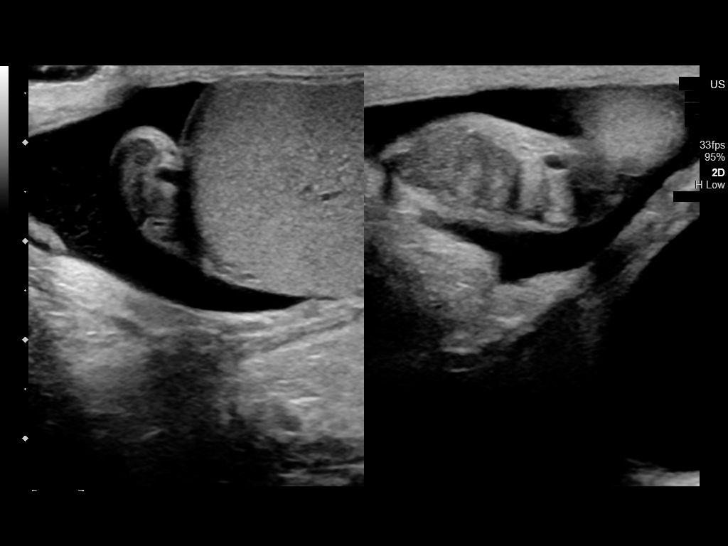
[im 59/65]
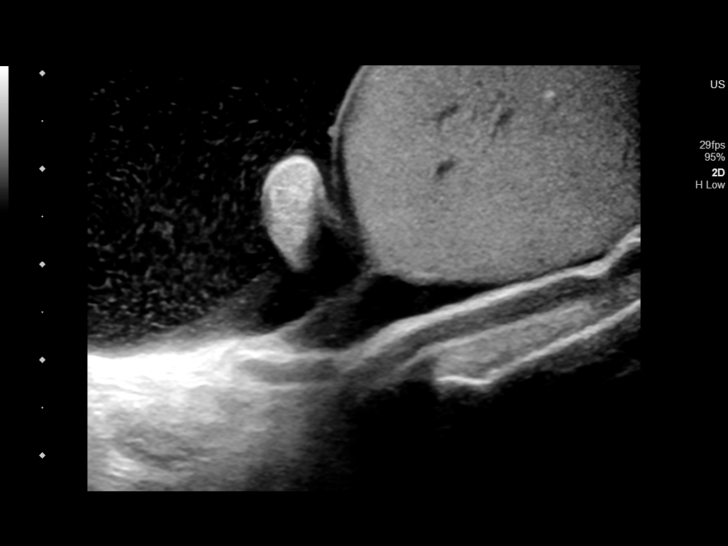
[im 65/65]
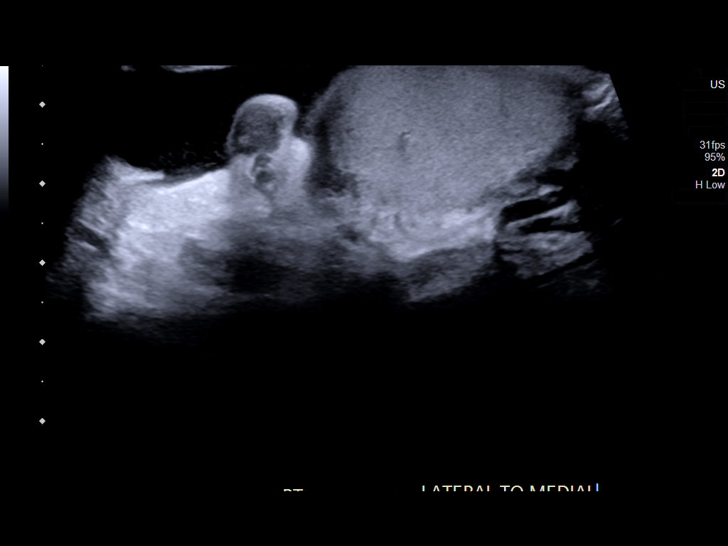

[13 of 25 positions shown; findings below may reference images not displayed]

FINDINGS: Right testicle

Measurements: 4.3 x 2.4 x 3.3 cm. Normal echogenicity without mass
or calcification. Internal blood flow present on color Doppler
imaging.

Left testicle

Measurements: 4.4 x 2.8 x 2.7 cm. Normal echogenicity without mass
or calcification. Internal blood flow present on color Doppler
imaging.

Right epididymis: Tiny cyst epididymal head 2 mm diameter. Otherwise
unremarkable.

Left epididymis:  Normal in size and appearance.

Hydrocele: Large BILATERAL hydroceles LEFT greater than RIGHT with
mobile debris

Varicocele:  Absent bilaterally

Pulsed Doppler interrogation of both testes demonstrates normal low
resistance arterial and venous waveforms bilaterally.
IMPRESSION: Large BILATERAL hydroceles LEFT greater than RIGHT containing
debris.

Otherwise normal exam.
